# Patient Record
Sex: Male | Born: 1940 | Race: White | Hispanic: No | Marital: Married | Smoking: Former smoker
Health system: Southern US, Community
[De-identification: ages and names within clinical notes are randomized; demographics above are authoritative.]

## PROBLEM LIST (undated history)

## (undated) DIAGNOSIS — I509 Heart failure, unspecified: Secondary | ICD-10-CM

## (undated) DIAGNOSIS — I10 Essential (primary) hypertension: Secondary | ICD-10-CM

## (undated) DIAGNOSIS — I471 Supraventricular tachycardia: Secondary | ICD-10-CM

## (undated) DIAGNOSIS — E119 Type 2 diabetes mellitus without complications: Secondary | ICD-10-CM

## (undated) DIAGNOSIS — J449 Chronic obstructive pulmonary disease, unspecified: Secondary | ICD-10-CM

## (undated) HISTORY — PX: SHOULDER OPEN ROTATOR CUFF REPAIR: SHX2407

## (undated) HISTORY — PX: CHOLECYSTECTOMY: SHX55

## (undated) HISTORY — PX: CARDIAC ELECTROPHYSIOLOGY MAPPING AND ABLATION: SHX1292

## (undated) HISTORY — PX: TONSILLECTOMY: SUR1361

## (undated) HISTORY — PX: HERNIA REPAIR: SHX51

---

## 2007-10-18 ENCOUNTER — Ambulatory Visit: Payer: Self-pay | Admitting: Emergency Medicine

## 2009-01-19 ENCOUNTER — Ambulatory Visit: Payer: Self-pay | Admitting: Internal Medicine

## 2009-11-13 ENCOUNTER — Ambulatory Visit: Payer: Self-pay | Admitting: Internal Medicine

## 2009-12-28 ENCOUNTER — Ambulatory Visit: Payer: Self-pay | Admitting: Internal Medicine

## 2011-12-04 ENCOUNTER — Ambulatory Visit: Payer: Self-pay

## 2012-02-24 DIAGNOSIS — M5137 Other intervertebral disc degeneration, lumbosacral region: Secondary | ICD-10-CM | POA: Diagnosis not present

## 2012-02-24 DIAGNOSIS — M999 Biomechanical lesion, unspecified: Secondary | ICD-10-CM | POA: Diagnosis not present

## 2012-03-09 ENCOUNTER — Ambulatory Visit: Payer: Self-pay | Admitting: Medical

## 2012-03-09 DIAGNOSIS — R05 Cough: Secondary | ICD-10-CM | POA: Diagnosis not present

## 2012-03-09 DIAGNOSIS — R059 Cough, unspecified: Secondary | ICD-10-CM | POA: Diagnosis not present

## 2012-03-09 DIAGNOSIS — J449 Chronic obstructive pulmonary disease, unspecified: Secondary | ICD-10-CM | POA: Diagnosis not present

## 2012-08-28 ENCOUNTER — Ambulatory Visit: Payer: Self-pay | Admitting: Internal Medicine

## 2012-08-28 DIAGNOSIS — E119 Type 2 diabetes mellitus without complications: Secondary | ICD-10-CM | POA: Diagnosis not present

## 2012-08-28 DIAGNOSIS — Z79899 Other long term (current) drug therapy: Secondary | ICD-10-CM | POA: Diagnosis not present

## 2012-08-28 DIAGNOSIS — Z9889 Other specified postprocedural states: Secondary | ICD-10-CM | POA: Diagnosis not present

## 2012-08-28 DIAGNOSIS — R05 Cough: Secondary | ICD-10-CM | POA: Diagnosis not present

## 2012-08-28 DIAGNOSIS — I10 Essential (primary) hypertension: Secondary | ICD-10-CM | POA: Diagnosis not present

## 2012-08-28 DIAGNOSIS — J40 Bronchitis, not specified as acute or chronic: Secondary | ICD-10-CM | POA: Diagnosis not present

## 2012-08-28 DIAGNOSIS — K219 Gastro-esophageal reflux disease without esophagitis: Secondary | ICD-10-CM | POA: Diagnosis not present

## 2012-11-04 DIAGNOSIS — I10 Essential (primary) hypertension: Secondary | ICD-10-CM | POA: Diagnosis not present

## 2012-11-04 DIAGNOSIS — E119 Type 2 diabetes mellitus without complications: Secondary | ICD-10-CM | POA: Diagnosis not present

## 2012-11-04 DIAGNOSIS — I517 Cardiomegaly: Secondary | ICD-10-CM | POA: Diagnosis not present

## 2012-11-04 DIAGNOSIS — I471 Supraventricular tachycardia: Secondary | ICD-10-CM | POA: Diagnosis not present

## 2012-11-04 DIAGNOSIS — F17200 Nicotine dependence, unspecified, uncomplicated: Secondary | ICD-10-CM | POA: Diagnosis not present

## 2012-11-04 DIAGNOSIS — R072 Precordial pain: Secondary | ICD-10-CM | POA: Diagnosis not present

## 2012-11-04 DIAGNOSIS — I498 Other specified cardiac arrhythmias: Secondary | ICD-10-CM | POA: Diagnosis not present

## 2012-11-04 DIAGNOSIS — Z8249 Family history of ischemic heart disease and other diseases of the circulatory system: Secondary | ICD-10-CM | POA: Diagnosis not present

## 2012-11-04 DIAGNOSIS — I2510 Atherosclerotic heart disease of native coronary artery without angina pectoris: Secondary | ICD-10-CM | POA: Diagnosis not present

## 2012-11-04 DIAGNOSIS — K219 Gastro-esophageal reflux disease without esophagitis: Secondary | ICD-10-CM | POA: Diagnosis not present

## 2012-11-04 DIAGNOSIS — J9819 Other pulmonary collapse: Secondary | ICD-10-CM | POA: Diagnosis not present

## 2012-11-04 DIAGNOSIS — I059 Rheumatic mitral valve disease, unspecified: Secondary | ICD-10-CM | POA: Diagnosis not present

## 2012-11-04 DIAGNOSIS — E876 Hypokalemia: Secondary | ICD-10-CM | POA: Diagnosis not present

## 2012-11-04 DIAGNOSIS — R0602 Shortness of breath: Secondary | ICD-10-CM | POA: Diagnosis not present

## 2012-11-04 DIAGNOSIS — R6889 Other general symptoms and signs: Secondary | ICD-10-CM | POA: Diagnosis not present

## 2012-11-04 DIAGNOSIS — E871 Hypo-osmolality and hyponatremia: Secondary | ICD-10-CM | POA: Diagnosis not present

## 2012-11-05 DIAGNOSIS — I498 Other specified cardiac arrhythmias: Secondary | ICD-10-CM | POA: Diagnosis not present

## 2012-11-05 DIAGNOSIS — I471 Supraventricular tachycardia, unspecified: Secondary | ICD-10-CM | POA: Diagnosis not present

## 2012-11-05 DIAGNOSIS — R072 Precordial pain: Secondary | ICD-10-CM | POA: Diagnosis not present

## 2012-11-05 DIAGNOSIS — I059 Rheumatic mitral valve disease, unspecified: Secondary | ICD-10-CM | POA: Diagnosis not present

## 2012-11-05 DIAGNOSIS — R0602 Shortness of breath: Secondary | ICD-10-CM | POA: Diagnosis not present

## 2013-05-28 DIAGNOSIS — M999 Biomechanical lesion, unspecified: Secondary | ICD-10-CM | POA: Diagnosis not present

## 2013-05-28 DIAGNOSIS — M5137 Other intervertebral disc degeneration, lumbosacral region: Secondary | ICD-10-CM | POA: Diagnosis not present

## 2013-09-22 DIAGNOSIS — M5137 Other intervertebral disc degeneration, lumbosacral region: Secondary | ICD-10-CM | POA: Diagnosis not present

## 2013-09-22 DIAGNOSIS — M999 Biomechanical lesion, unspecified: Secondary | ICD-10-CM | POA: Diagnosis not present

## 2013-09-28 DIAGNOSIS — M999 Biomechanical lesion, unspecified: Secondary | ICD-10-CM | POA: Diagnosis not present

## 2013-09-28 DIAGNOSIS — M5137 Other intervertebral disc degeneration, lumbosacral region: Secondary | ICD-10-CM | POA: Diagnosis not present

## 2014-07-05 DIAGNOSIS — M999 Biomechanical lesion, unspecified: Secondary | ICD-10-CM | POA: Diagnosis not present

## 2014-07-05 DIAGNOSIS — M5137 Other intervertebral disc degeneration, lumbosacral region: Secondary | ICD-10-CM | POA: Diagnosis not present

## 2014-07-08 DIAGNOSIS — M999 Biomechanical lesion, unspecified: Secondary | ICD-10-CM | POA: Diagnosis not present

## 2014-07-08 DIAGNOSIS — M5137 Other intervertebral disc degeneration, lumbosacral region: Secondary | ICD-10-CM | POA: Diagnosis not present

## 2015-03-20 ENCOUNTER — Ambulatory Visit
Admission: EM | Admit: 2015-03-20 | Discharge: 2015-03-20 | Disposition: A | Discharge status: Home / Self Care | Payer: Medicare Other | Attending: Family Medicine | Admitting: Family Medicine

## 2015-03-20 DIAGNOSIS — J209 Acute bronchitis, unspecified: Secondary | ICD-10-CM

## 2015-03-20 DIAGNOSIS — J441 Chronic obstructive pulmonary disease with (acute) exacerbation: Secondary | ICD-10-CM

## 2015-03-20 DIAGNOSIS — J440 Chronic obstructive pulmonary disease with acute lower respiratory infection: Principal | ICD-10-CM

## 2015-03-20 HISTORY — DX: Heart failure, unspecified: I50.9

## 2015-03-20 HISTORY — DX: Type 2 diabetes mellitus without complications: E11.9

## 2015-03-20 HISTORY — DX: Essential (primary) hypertension: I10

## 2015-03-20 HISTORY — DX: Chronic obstructive pulmonary disease, unspecified: J44.9

## 2015-03-20 HISTORY — DX: Supraventricular tachycardia: I47.1

## 2015-03-20 MED ORDER — HYDROCOD POLST-CPM POLST ER 10-8 MG/5ML PO SUER: 5.0000 mL | Freq: Two times a day (BID) | ORAL | Status: DC

## 2015-03-20 MED ORDER — SULFAMETHOXAZOLE-TRIMETHOPRIM 800-160 MG PO TABS: 1.0000 | ORAL_TABLET | Freq: Two times a day (BID) | ORAL | Status: DC

## 2015-03-20 NOTE — ED Notes (Signed)
Pt c/o sinus and chest congestion for the past 3-4 days.  

## 2015-03-20 NOTE — ED Provider Notes (Signed)
CSN: [id number]     Arrival date & time 03/20/15  1150 History   First MD Initiated Contact with Patient 03/20/15 1316     Chief Complaint  Patient presents with  . Cough  . Nasal Congestion   (Consider location/radiation/quality/duration/timing/severity/associated sxs/prior Treatment) Patient is a 74 y.o. male presenting with cough. The history is provided by the patient.  Cough Cough characteristics:  Productive Sputum characteristics:  Nondescript Duration:  4 days Timing:  Constant Associated symptoms: wheezing   Associated symptoms: no ear pain, no eye discharge and no fever   Risk factors comment:  Diabetes (uncontrolled per patient); former smoker; h/o COPD   Past Medical History  Diagnosis Date  . Hypertension   . Diabetes mellitus without complication   . COPD (chronic obstructive pulmonary disease)   . CHF (congestive heart failure)   . SVT (supraventricular tachycardia)    Past Surgical History  Procedure Laterality Date  . Tonsillectomy    . Cholecystectomy    . Hernia repair    . Shoulder open rotator cuff repair Right   . Cardiac electrophysiology mapping and ablation     No family history on file. History  Substance Use Topics  . Smoking status: Former Games developermoker  . Smokeless tobacco: Never Used  . Alcohol Use: No    Review of Systems  Constitutional: Negative for fever.  HENT: Negative for ear pain.   Eyes: Negative for discharge.  Respiratory: Positive for cough and wheezing.     Allergies  Ace inhibitors  Home Medications   Prior to Admission medications   Medication Sig Start Date End Date Taking? Authorizing Provider  losartan (COZAAR) 25 MG tablet Take 25 mg by mouth daily.   Yes Historical Provider, MD  metFORMIN (GLUCOPHAGE) 500 MG tablet Take by mouth 2 (two) times daily with a meal.   Yes Historical Provider, MD  chlorpheniramine-HYDROcodone (TUSSIONEX PENNKINETIC ER) 10-8 MG/5ML SUER Take 5 mLs by mouth 2 (two) times daily. 03/20/15    Payton Mccallumrlando Levie Owensby, MD  sulfamethoxazole-trimethoprim (BACTRIM DS,SEPTRA DS) 800-160 MG per tablet Take 1 tablet by mouth 2 (two) times daily. 03/20/15   Payton Mccallumrlando Severa Jeremiah, MD   BP 151/70 mmHg  Pulse 70  Temp(Src) 98.6 F (37 C) (Oral)  Resp 18  Ht 6\' 3"  (1.905 m)  Wt 250 lb (113.399 kg)  BMI 31.25 kg/m2  SpO2 98% Physical Exam  Constitutional: He appears well-developed and well-nourished. No distress.  HENT:  Head: Normocephalic and atraumatic.  Right Ear: Tympanic membrane, external ear and ear canal normal.  Left Ear: Tympanic membrane, external ear and ear canal normal.  Nose: Nose normal.  Mouth/Throat: Uvula is midline, oropharynx is clear and moist and mucous membranes are normal. No oropharyngeal exudate or tonsillar abscesses.  Eyes: Conjunctivae and EOM are normal. Pupils are equal, round, and reactive to light. Right eye exhibits no discharge. Left eye exhibits no discharge. No scleral icterus.  Neck: Normal range of motion. Neck supple. No tracheal deviation present. No thyromegaly present.  Cardiovascular: Normal rate, regular rhythm and normal heart sounds.   Pulmonary/Chest: Effort normal. No stridor. No respiratory distress. He has wheezes. He has no rales. He exhibits no tenderness.  Diffuse expiratory wheezes and rhonchi bilaterally  Lymphadenopathy:    He has no cervical adenopathy.  Neurological: He is alert.  Skin: Skin is warm and dry. No rash noted. He is not diaphoretic.  Nursing note and vitals reviewed.   ED Course  Procedures (including critical care time) Labs Review Labs Reviewed -  No data to display  Imaging Review No results found.   MDM   1. Acute bronchitis with chronic obstructive pulmonary disease (COPD)    Discharge Medication List as of 03/20/2015  1:29 PM    START taking these medications   Details  chlorpheniramine-HYDROcodone (TUSSIONEX PENNKINETIC ER) 10-8 MG/5ML SUER Take 5 mLs by mouth 2 (two) times daily., Starting 03/20/2015, Until  Discontinued, Normal    sulfamethoxazole-trimethoprim (BACTRIM DS,SEPTRA DS) 800-160 MG per tablet Take 1 tablet by mouth 2 (two) times daily., Starting 03/20/2015, Until Discontinued, Normal      Plan: 1. Test/x-ray results and diagnosis reviewed with patient 2. rx as per orders; risks, benefits, potential side effects reviewed with patient 3. Recommend supportive treatment with rest, increased fluids 4. F/u prn if symptoms worsen or don't improve    Payton Mccallum, MD 03/20/15 1344

## 2015-05-26 DIAGNOSIS — M9903 Segmental and somatic dysfunction of lumbar region: Secondary | ICD-10-CM | POA: Diagnosis not present

## 2015-05-26 DIAGNOSIS — M9905 Segmental and somatic dysfunction of pelvic region: Secondary | ICD-10-CM | POA: Diagnosis not present

## 2015-05-26 DIAGNOSIS — M9902 Segmental and somatic dysfunction of thoracic region: Secondary | ICD-10-CM | POA: Diagnosis not present

## 2015-05-26 DIAGNOSIS — M9904 Segmental and somatic dysfunction of sacral region: Secondary | ICD-10-CM | POA: Diagnosis not present

## 2015-05-26 DIAGNOSIS — M9901 Segmental and somatic dysfunction of cervical region: Secondary | ICD-10-CM | POA: Diagnosis not present

## 2015-05-26 DIAGNOSIS — M5137 Other intervertebral disc degeneration, lumbosacral region: Secondary | ICD-10-CM | POA: Diagnosis not present

## 2015-05-26 DIAGNOSIS — M791 Myalgia: Secondary | ICD-10-CM | POA: Diagnosis not present

## 2015-05-29 DIAGNOSIS — M9902 Segmental and somatic dysfunction of thoracic region: Secondary | ICD-10-CM | POA: Diagnosis not present

## 2015-05-29 DIAGNOSIS — M5137 Other intervertebral disc degeneration, lumbosacral region: Secondary | ICD-10-CM | POA: Diagnosis not present

## 2015-05-29 DIAGNOSIS — M9905 Segmental and somatic dysfunction of pelvic region: Secondary | ICD-10-CM | POA: Diagnosis not present

## 2015-05-29 DIAGNOSIS — M791 Myalgia: Secondary | ICD-10-CM | POA: Diagnosis not present

## 2015-05-29 DIAGNOSIS — M9904 Segmental and somatic dysfunction of sacral region: Secondary | ICD-10-CM | POA: Diagnosis not present

## 2015-05-29 DIAGNOSIS — M9903 Segmental and somatic dysfunction of lumbar region: Secondary | ICD-10-CM | POA: Diagnosis not present

## 2015-05-29 DIAGNOSIS — M9901 Segmental and somatic dysfunction of cervical region: Secondary | ICD-10-CM | POA: Diagnosis not present

## 2015-06-05 DIAGNOSIS — M9903 Segmental and somatic dysfunction of lumbar region: Secondary | ICD-10-CM | POA: Diagnosis not present

## 2015-06-05 DIAGNOSIS — M9904 Segmental and somatic dysfunction of sacral region: Secondary | ICD-10-CM | POA: Diagnosis not present

## 2015-06-05 DIAGNOSIS — M9905 Segmental and somatic dysfunction of pelvic region: Secondary | ICD-10-CM | POA: Diagnosis not present

## 2015-06-05 DIAGNOSIS — M9901 Segmental and somatic dysfunction of cervical region: Secondary | ICD-10-CM | POA: Diagnosis not present

## 2015-06-05 DIAGNOSIS — M791 Myalgia: Secondary | ICD-10-CM | POA: Diagnosis not present

## 2015-06-05 DIAGNOSIS — M9902 Segmental and somatic dysfunction of thoracic region: Secondary | ICD-10-CM | POA: Diagnosis not present

## 2015-06-05 DIAGNOSIS — M5137 Other intervertebral disc degeneration, lumbosacral region: Secondary | ICD-10-CM | POA: Diagnosis not present

## 2015-06-09 DIAGNOSIS — M9905 Segmental and somatic dysfunction of pelvic region: Secondary | ICD-10-CM | POA: Diagnosis not present

## 2015-06-09 DIAGNOSIS — M5137 Other intervertebral disc degeneration, lumbosacral region: Secondary | ICD-10-CM | POA: Diagnosis not present

## 2015-06-09 DIAGNOSIS — M791 Myalgia: Secondary | ICD-10-CM | POA: Diagnosis not present

## 2015-06-09 DIAGNOSIS — M9902 Segmental and somatic dysfunction of thoracic region: Secondary | ICD-10-CM | POA: Diagnosis not present

## 2015-06-09 DIAGNOSIS — M9904 Segmental and somatic dysfunction of sacral region: Secondary | ICD-10-CM | POA: Diagnosis not present

## 2015-06-09 DIAGNOSIS — M9901 Segmental and somatic dysfunction of cervical region: Secondary | ICD-10-CM | POA: Diagnosis not present

## 2015-06-09 DIAGNOSIS — M9903 Segmental and somatic dysfunction of lumbar region: Secondary | ICD-10-CM | POA: Diagnosis not present

## 2015-06-20 DIAGNOSIS — M9904 Segmental and somatic dysfunction of sacral region: Secondary | ICD-10-CM | POA: Diagnosis not present

## 2015-06-20 DIAGNOSIS — M791 Myalgia: Secondary | ICD-10-CM | POA: Diagnosis not present

## 2015-06-20 DIAGNOSIS — M9901 Segmental and somatic dysfunction of cervical region: Secondary | ICD-10-CM | POA: Diagnosis not present

## 2015-06-20 DIAGNOSIS — M9903 Segmental and somatic dysfunction of lumbar region: Secondary | ICD-10-CM | POA: Diagnosis not present

## 2015-06-20 DIAGNOSIS — M5137 Other intervertebral disc degeneration, lumbosacral region: Secondary | ICD-10-CM | POA: Diagnosis not present

## 2015-06-20 DIAGNOSIS — M9905 Segmental and somatic dysfunction of pelvic region: Secondary | ICD-10-CM | POA: Diagnosis not present

## 2015-06-20 DIAGNOSIS — M9902 Segmental and somatic dysfunction of thoracic region: Secondary | ICD-10-CM | POA: Diagnosis not present

## 2015-06-23 DIAGNOSIS — M9905 Segmental and somatic dysfunction of pelvic region: Secondary | ICD-10-CM | POA: Diagnosis not present

## 2015-06-23 DIAGNOSIS — M5137 Other intervertebral disc degeneration, lumbosacral region: Secondary | ICD-10-CM | POA: Diagnosis not present

## 2015-06-23 DIAGNOSIS — M791 Myalgia: Secondary | ICD-10-CM | POA: Diagnosis not present

## 2015-06-23 DIAGNOSIS — M9903 Segmental and somatic dysfunction of lumbar region: Secondary | ICD-10-CM | POA: Diagnosis not present

## 2015-06-23 DIAGNOSIS — M9902 Segmental and somatic dysfunction of thoracic region: Secondary | ICD-10-CM | POA: Diagnosis not present

## 2015-06-23 DIAGNOSIS — M9904 Segmental and somatic dysfunction of sacral region: Secondary | ICD-10-CM | POA: Diagnosis not present

## 2015-06-23 DIAGNOSIS — M9901 Segmental and somatic dysfunction of cervical region: Secondary | ICD-10-CM | POA: Diagnosis not present

## 2015-07-06 DIAGNOSIS — M791 Myalgia: Secondary | ICD-10-CM | POA: Diagnosis not present

## 2015-07-06 DIAGNOSIS — M5137 Other intervertebral disc degeneration, lumbosacral region: Secondary | ICD-10-CM | POA: Diagnosis not present

## 2015-07-06 DIAGNOSIS — M9902 Segmental and somatic dysfunction of thoracic region: Secondary | ICD-10-CM | POA: Diagnosis not present

## 2015-07-06 DIAGNOSIS — M9901 Segmental and somatic dysfunction of cervical region: Secondary | ICD-10-CM | POA: Diagnosis not present

## 2015-07-06 DIAGNOSIS — M9905 Segmental and somatic dysfunction of pelvic region: Secondary | ICD-10-CM | POA: Diagnosis not present

## 2015-07-06 DIAGNOSIS — M9903 Segmental and somatic dysfunction of lumbar region: Secondary | ICD-10-CM | POA: Diagnosis not present

## 2015-07-06 DIAGNOSIS — M9904 Segmental and somatic dysfunction of sacral region: Secondary | ICD-10-CM | POA: Diagnosis not present

## 2015-10-03 ENCOUNTER — Ambulatory Visit: Payer: Medicare Other

## 2015-10-03 ENCOUNTER — Ambulatory Visit
Admission: EM | Admit: 2015-10-03 | Discharge: 2015-10-03 | Disposition: A | Discharge status: Home / Self Care | Payer: Medicare Other | Attending: Family Medicine | Admitting: Family Medicine

## 2015-10-03 DIAGNOSIS — J440 Chronic obstructive pulmonary disease with acute lower respiratory infection: Secondary | ICD-10-CM | POA: Diagnosis not present

## 2015-10-03 DIAGNOSIS — R05 Cough: Secondary | ICD-10-CM | POA: Diagnosis present

## 2015-10-03 DIAGNOSIS — I471 Supraventricular tachycardia: Secondary | ICD-10-CM | POA: Insufficient documentation

## 2015-10-03 DIAGNOSIS — I10 Essential (primary) hypertension: Secondary | ICD-10-CM | POA: Diagnosis not present

## 2015-10-03 DIAGNOSIS — Z7984 Long term (current) use of oral hypoglycemic drugs: Secondary | ICD-10-CM | POA: Insufficient documentation

## 2015-10-03 DIAGNOSIS — E119 Type 2 diabetes mellitus without complications: Secondary | ICD-10-CM | POA: Diagnosis not present

## 2015-10-03 DIAGNOSIS — I509 Heart failure, unspecified: Secondary | ICD-10-CM | POA: Diagnosis not present

## 2015-10-03 DIAGNOSIS — Z79899 Other long term (current) drug therapy: Secondary | ICD-10-CM | POA: Diagnosis not present

## 2015-10-03 DIAGNOSIS — J209 Acute bronchitis, unspecified: Secondary | ICD-10-CM | POA: Insufficient documentation

## 2015-10-03 DIAGNOSIS — Z87891 Personal history of nicotine dependence: Secondary | ICD-10-CM | POA: Insufficient documentation

## 2015-10-03 DIAGNOSIS — R0602 Shortness of breath: Secondary | ICD-10-CM | POA: Diagnosis not present

## 2015-10-03 MED ORDER — HYDROCOD POLST-CPM POLST ER 10-8 MG/5ML PO SUER: 5.0000 mL | Freq: Two times a day (BID) | ORAL | Status: DC

## 2015-10-03 MED ORDER — SULFAMETHOXAZOLE-TRIMETHOPRIM 800-160 MG PO TABS: 1.0000 | ORAL_TABLET | Freq: Two times a day (BID) | ORAL | Status: DC

## 2015-10-03 NOTE — ED Notes (Signed)
C/o cough x 1 week. "I get bronchitis once a year". Fever yesterday.

## 2015-10-03 NOTE — ED Provider Notes (Signed)
CSN: [id number]     Arrival date & time 10/03/15  1412 History   First MD Initiated Contact with Patient 10/03/15 1612     Chief Complaint  Patient presents with  . URI   (Consider location/radiation/quality/duration/timing/severity/associated sxs/prior Treatment) HPI   This 74 year old man who presents with a one-week history of a nonproductive cough. He states that he has a history of COPD and CHF along with hypertension and diabetes mellitus. He was seen here in my records review on June of this year with very similar symptoms at that time he was treated with Tasigna next cough syrup and with the Septra DS. States that that seemed to make him feel better. He has several inhalers at home but he never uses them. He does say that he gets bronchitis at least once a year. He was feverish yesterday and today is temperature is 90.9 to pulse 75 respirations 22 and O2 sat on room air is 100%.   Past Medical History  Diagnosis Date  . Hypertension   . Diabetes mellitus without complication (HCC)   . COPD (chronic obstructive pulmonary disease) (HCC)   . CHF (congestive heart failure) (HCC)   . SVT (supraventricular tachycardia) [facility](HCC)    Past Surgical History  Procedure Laterality Date  . Tonsillectomy    . Cholecystectomy    . Hernia repair    . Shoulder open rotator cuff repair Right   . Cardiac electrophysiology mapping and ablation     Family History  Problem Relation Age of Onset  . Emphysema Father    Social History  Substance Use Topics  . Smoking status: Former Games developermoker  . Smokeless tobacco: Never Used  . Alcohol Use: No    Review of Systems  Constitutional: Positive for fever. Negative for chills, activity change and appetite change.  HENT: Positive for congestion.   Respiratory: Positive for cough, shortness of breath and wheezing.   All other systems reviewed and are negative.   Allergies  Ace inhibitors  Home Medications   Prior to Admission medications    Medication Sig Start Date End Date Taking? Authorizing Provider  furosemide (LASIX) 20 MG tablet Take 40 mg by mouth daily.   Yes Historical Provider, MD  lisinopril (PRINIVIL,ZESTRIL) 10 MG tablet Take 10 mg by mouth daily.   Yes Historical Provider, MD  omeprazole (PRILOSEC) 40 MG capsule Take 40 mg by mouth daily.   Yes Historical Provider, MD  chlorpheniramine-HYDROcodone (TUSSIONEX PENNKINETIC ER) 10-8 MG/5ML SUER Take 5 mLs by mouth 2 (two) times daily. 10/03/15   Lutricia FeilWilliam P Lynden Carrithers, PA-C  losartan (COZAAR) 25 MG tablet Take 25 mg by mouth daily.    Historical Provider, MD  metFORMIN (GLUCOPHAGE) 500 MG tablet Take by mouth 2 (two) times daily with a meal.    Historical Provider, MD  sulfamethoxazole-trimethoprim (BACTRIM DS,SEPTRA DS) 800-160 MG tablet Take 1 tablet by mouth 2 (two) times daily. 10/03/15   Lutricia FeilWilliam P Brantley Naser, PA-C   Meds Ordered and Administered this Visit  Medications - No data to display  BP 189/92 mmHg  Pulse 75  Temp(Src) 99.2 F (37.3 C) (Tympanic)  Resp 22  Ht 6\' 3"  (1.905 m)  Wt 233 lb (105.688 kg)  BMI 29.12 kg/m2  SpO2 100% No data found.   Physical Exam  Constitutional: He is oriented to person, place, and time. He appears well-developed and well-nourished. No distress.  HENT:  Head: Normocephalic and atraumatic.  Eyes: Conjunctivae are normal. Pupils are equal, round, and reactive to light.  Neck: Normal range of motion. Neck supple.  Pulmonary/Chest: Effort normal. No respiratory distress. He has wheezes. He has rales. He exhibits no tenderness.  Musculoskeletal: He exhibits no edema or tenderness.  Patient has a mild antalgic gait due to a new problem that he missed last couple of days. This was not examined.  Neurological: He is alert and oriented to person, place, and time.  Skin: Skin is warm and dry. He is not diaphoretic.  Psychiatric: He has a normal mood and affect. His behavior is normal. Judgment and thought content normal.  Nursing note  and vitals reviewed.   ED Course  Procedures (including critical care time)  Labs Review Labs Reviewed - No data to display  Imaging Review Dg Chest 2 View  10/03/2015  CLINICAL DATA:  Cough and shortness of breath for 2 weeks. History of bronchitis, pneumonia, CHF hypertension. Former smoker. EXAM: CHEST  2 VIEW COMPARISON:  08/28/2012; 03/09/2012 FINDINGS: Grossly unchanged cardiac silhouette and mediastinal contours. The lungs are hyperexpanded with flattening of the diaphragms. Left basilar opacities are unchanged and favored to represent a prominent epicardial fat pad. No focal airspace opacities. No pleural effusion or pneumothorax. No evidence of edema. No acute osseous abnormalities. Stigmata of DISH throughout the thoracic spine. Post right-sided rotator cuff repair. IMPRESSION: Hyperexpanded lungs without acute cardiopulmonary disease Electronically Signed   By: Simonne Come M.D.   On: 10/03/2015 16:46     Visual Acuity Review  Right Eye Distance:   Left Eye Distance:   Bilateral Distance:    Right Eye Near:   Left Eye Near:    Bilateral Near:     I offered the patient a breathing treatment but he refused and decided that he would rather do it at home since he has materials and medications at home    MDM   1. Acute bronchitis with chronic obstructive pulmonary disease (COPD) (HCC)    New Prescriptions   CHLORPHENIRAMINE-HYDROCODONE (TUSSIONEX PENNKINETIC ER) 10-8 MG/5ML SUER    Take 5 mLs by mouth 2 (two) times daily.   SULFAMETHOXAZOLE-TRIMETHOPRIM (BACTRIM DS,SEPTRA DS) 800-160 MG TABLET    Take 1 tablet by mouth 2 (two) times daily.  Plan: 1. Test/x-ray results and diagnosis reviewed with patient 2. rx as per orders; risks, benefits, potential side effects reviewed with patient 3. Recommend supportive treatment with breathing treatments at home. IBU/tylenol PRN 4. F/u at [facility] with his PCP next week.    Lutricia Feil, PA-C 10/03/15 1700

## 2015-10-03 NOTE — Discharge Instructions (Signed)
Acute Bronchitis Bronchitis is inflammation of the airways that extend from the windpipe into the lungs (bronchi). The inflammation often causes mucus to develop. This leads to a cough, which is the most common symptom of bronchitis.  In acute bronchitis, the condition usually develops suddenly and goes away over time, usually in a couple weeks. Smoking, allergies, and asthma can make bronchitis worse. Repeated episodes of bronchitis may cause further lung problems.  CAUSES Acute bronchitis is most often caused by the same virus that causes a cold. The virus can spread from person to person (contagious) through coughing, sneezing, and touching contaminated objects. SIGNS AND SYMPTOMS  1. Cough.  2. Fever.  3. Coughing up mucus.  4. Body aches.  5. Chest congestion.  6. Chills.  7. Shortness of breath.  8. Sore throat.  DIAGNOSIS  Acute bronchitis is usually diagnosed through a physical exam. Your health care provider will also ask you questions about your medical history. Tests, such as chest X-rays, are sometimes done to rule out other conditions.  TREATMENT  Acute bronchitis usually goes away in a couple weeks. Oftentimes, no medical treatment is necessary. Medicines are sometimes given for relief of fever or cough. Antibiotic medicines are usually not needed but may be prescribed in certain situations. In some cases, an inhaler may be recommended to help reduce shortness of breath and control the cough. A cool mist vaporizer may also be used to help thin bronchial secretions and make it easier to clear the chest.  HOME CARE INSTRUCTIONS 1. Get plenty of rest.  2. Drink enough fluids to keep your urine clear or pale yellow (unless you have a medical condition that requires fluid restriction). Increasing fluids may help thin your respiratory secretions (sputum) and reduce chest congestion, and it will prevent dehydration.  3. Take medicines only as directed by your health care  provider. 4. If you were prescribed an antibiotic medicine, finish it all even if you start to feel better. 5. Avoid smoking and secondhand smoke. Exposure to cigarette smoke or irritating chemicals will make bronchitis worse. If you are a smoker, consider using nicotine gum or skin patches to help control withdrawal symptoms. Quitting smoking will help your lungs heal faster.  6. Reduce the chances of another bout of acute bronchitis by washing your hands frequently, avoiding people with cold symptoms, and trying not to touch your hands to your mouth, nose, or eyes.  7. Keep all follow-up visits as directed by your health care provider.  SEEK MEDICAL CARE IF: Your symptoms do not improve after 1 week of treatment.  SEEK IMMEDIATE MEDICAL CARE IF:  You develop an increased fever or chills.   You have chest pain.   You have severe shortness of breath.  You have bloody sputum.   You develop dehydration.  You faint or repeatedly feel like you are going to pass out.  You develop repeated vomiting.  You develop a severe headache. MAKE SURE YOU:   Understand these instructions.  Will watch your condition.  Will get help right away if you are not doing well or get worse.   This information is not intended to replace advice given to you by your health care provider. Make sure you discuss any questions you have with your health care provider.   Document Released: 11/07/2004 Document Revised: 10/21/2014 Document Reviewed: 03/23/2013 Elsevier Interactive Patient Education 2016 [employer]  Chronic Obstructive Pulmonary Disease Exacerbation Chronic obstructive pulmonary disease (COPD) is a common lung condition in which airflow  from the lungs is limited. COPD is a general term that can be used to describe many different lung problems that limit airflow, including chronic bronchitis and emphysema. COPD exacerbations are episodes when breathing symptoms become much worse and require  extra treatment. Without treatment, COPD exacerbations can be life threatening, and frequent COPD exacerbations can cause further damage to your lungs. CAUSES 9. Respiratory infections. 10. Exposure to smoke. 11. Exposure to air pollution, chemical fumes, or dust. Sometimes there is no apparent cause or trigger. RISK FACTORS 8. Smoking cigarettes. 74. Older age. 10. Frequent prior COPD exacerbations. SIGNS AND SYMPTOMS  Increased coughing.  Increased thick spit (sputum) production.  Increased wheezing.  Increased shortness of breath.  Rapid breathing.  Chest tightness. DIAGNOSIS Your medical history, a physical exam, and tests will help your health care provider make a diagnosis. Tests may include:  A chest X-ray.  Basic lab tests.  Sputum testing.  An arterial blood gas test. TREATMENT Depending on the severity of your COPD exacerbation, you may need to be admitted to a hospital for treatment. Some of the treatments commonly used to treat COPD exacerbations are:   Antibiotic medicines.  Bronchodilators. These are drugs that expand the air passages. They may be given with an inhaler or nebulizer. Spacer devices may be needed to help improve drug delivery.  Corticosteroid medicines.  Supplemental oxygen therapy.  Airway clearing techniques, such as noninvasive ventilation (NIV) and positive expiratory pressure (PEP). These provide respiratory support through a mask or other noninvasive device. HOME CARE INSTRUCTIONS  Do not smoke. Quitting smoking is very important to prevent COPD from getting worse and exacerbations from happening as often.  Avoid exposure to all substances that irritate the airway, especially to tobacco smoke.  If you were prescribed an antibiotic medicine, finish it all even if you start to feel better.  Take all medicines as directed by your health care provider.It is important to use correct technique with inhaled medicines.  Drink enough  fluids to keep your urine clear or pale yellow (unless you have a medical condition that requires fluid restriction).  Use a cool mist vaporizer. This makes it easier to clear your chest when you cough.  If you have a home nebulizer and oxygen, continue to use them as directed.  Maintain all necessary vaccinations to prevent infections.  Exercise regularly.  Eat a healthy diet.  Keep all follow-up appointments as directed by your health care provider. SEEK IMMEDIATE MEDICAL CARE IF:  You have worsening shortness of breath.  You have trouble talking.  You have severe chest pain.  You have blood in your sputum.  You have a fever.  You have weakness, vomit repeatedly, or faint.  You feel confused.  You continue to get worse. MAKE SURE YOU:  Understand these instructions.  Will watch your condition.  Will get help right away if you are not doing well or get worse.   This information is not intended to replace advice given to you by your health care provider. Make sure you discuss any questions you have with your health care provider.   Document Released: 07/28/2007 Document Revised: 10/21/2014 Document Reviewed: 06/04/2013 Elsevier Interactive Patient Education 2016 [employer].  How to Use an Inhaler Proper inhaler technique is very important. Good technique ensures that the medicine reaches the lungs. Poor technique results in depositing the medicine on the tongue and back of the throat rather than in the airways. If you do not use the inhaler with good technique, the  medicine will not help you. STEPS TO FOLLOW IF USING AN INHALER WITHOUT AN EXTENSION TUBE 12. Remove the cap from the inhaler. 13. If you are using the inhaler for the first time, you will need to prime it. Shake the inhaler for 5 seconds and release four puffs into the air, away from your face. Ask your health care provider or pharmacist if you have questions about priming your inhaler. 14. Shake the  inhaler for 5 seconds before each breath in (inhalation). 15. Position the inhaler so that the top of the canister faces up. 16. Put your index finger on the top of the medicine canister. Your thumb supports the bottom of the inhaler. 17. Open your mouth. 18. Either place the inhaler between your teeth and place your lips tightly around the mouthpiece, or hold the inhaler 1-2 inches away from your open mouth. If you are unsure of which technique to use, ask your health care provider. 19. Breathe out (exhale) normally and as completely as possible. 20. Press the canister down with your index finger to release the medicine. 21. At the same time as the canister is pressed, inhale deeply and slowly until your lungs are completely filled. This should take 4-6 seconds. Keep your tongue down. 22. Hold the medicine in your lungs for 5-10 seconds (10 seconds is best). This helps the medicine get into the small airways of your lungs. 23. Breathe out slowly, through pursed lips. Whistling is an example of pursed lips. 24. Wait at least 15-30 seconds between puffs. Continue with the above steps until you have taken the number of puffs your health care provider has ordered. Do not use the inhaler more than your health care provider tells you. 25. Replace the cap on the inhaler. 26. Follow the directions from your health care provider or the inhaler insert for cleaning the inhaler. STEPS TO FOLLOW IF USING AN INHALER WITH AN EXTENSION (SPACER) 11. Remove the cap from the inhaler. 12. If you are using the inhaler for the first time, you will need to prime it. Shake the inhaler for 5 seconds and release four puffs into the air, away from your face. Ask your health care provider or pharmacist if you have questions about priming your inhaler. 13. Shake the inhaler for 5 seconds before each breath in (inhalation). 14. Place the open end of the spacer onto the mouthpiece of the inhaler. 15. Position the inhaler so  that the top of the canister faces up and the spacer mouthpiece faces you. 16. Put your index finger on the top of the medicine canister. Your thumb supports the bottom of the inhaler and the spacer. 17. Breathe out (exhale) normally and as completely as possible. 18. Immediately after exhaling, place the spacer between your teeth and into your mouth. Close your lips tightly around the spacer. 19. Press the canister down with your index finger to release the medicine. 20. At the same time as the canister is pressed, inhale deeply and slowly until your lungs are completely filled. This should take 4-6 seconds. Keep your tongue down and out of the way. 21. Hold the medicine in your lungs for 5-10 seconds (10 seconds is best). This helps the medicine get into the small airways of your lungs. Exhale. 22. Repeat inhaling deeply through the spacer mouthpiece. Again hold that breath for up to 10 seconds (10 seconds is best). Exhale slowly. If it is difficult to take this second deep breath through the spacer, breathe normally several times  through the spacer. Remove the spacer from your mouth. 23. Wait at least 15-30 seconds between puffs. Continue with the above steps until you have taken the number of puffs your health care provider has ordered. Do not use the inhaler more than your health care provider tells you. 24. Remove the spacer from the inhaler, and place the cap on the inhaler. 25. Follow the directions from your health care provider or the inhaler insert for cleaning the inhaler and spacer. If you are using different kinds of inhalers, use your quick relief medicine to open the airways 10-15 minutes before using a steroid if instructed to do so by your health care provider. If you are unsure which inhalers to use and the order of using them, ask your health care provider, nurse, or respiratory therapist. If you are using a steroid inhaler, always rinse your mouth with water after your last puff,  then gargle and spit out the water. Do not swallow the water. AVOID:  Inhaling before or after starting the spray of medicine. It takes practice to coordinate your breathing with triggering the spray.  Inhaling through the nose (rather than the mouth) when triggering the spray. HOW TO DETERMINE IF YOUR INHALER IS FULL OR NEARLY EMPTY You cannot know when an inhaler is empty by shaking it. A few inhalers are now being made with dose counters. Ask your health care provider for a prescription that has a dose counter if you feel you need that extra help. If your inhaler does not have a counter, ask your health care provider to help you determine the date you need to refill your inhaler. Write the refill date on a calendar or your inhaler canister. Refill your inhaler 7-10 days before it runs out. Be sure to keep an adequate supply of medicine. This includes making sure it is not expired, and that you have a spare inhaler.  SEEK MEDICAL CARE IF:   Your symptoms are only partially relieved with your inhaler.  You are having trouble using your inhaler.  You have some increase in phlegm. SEEK IMMEDIATE MEDICAL CARE IF:   You feel little or no relief with your inhalers. You are still wheezing and are feeling shortness of breath or tightness in your chest or both.  You have dizziness, headaches, or a fast heart rate.  You have chills, fever, or night sweats.  You have a noticeable increase in phlegm production, or there is blood in the phlegm. MAKE SURE YOU:   Understand these instructions.  Will watch your condition.  Will get help right away if you are not doing well or get worse.   This information is not intended to replace advice given to you by your health care provider. Make sure you discuss any questions you have with your health care provider.   Document Released: 09/27/2000 Document Revised: 07/21/2013 Document Reviewed: 04/29/2013 Elsevier Interactive Patient Education [employer].

## 2016-10-28 ENCOUNTER — Institutional Professional Consult (permissible substitution): Payer: Medicare Other | Admitting: Cardiology

## 2016-11-06 ENCOUNTER — Encounter: Payer: Self-pay | Admitting: *Deleted

## 2016-11-12 ENCOUNTER — Ambulatory Visit (INDEPENDENT_AMBULATORY_CARE_PROVIDER_SITE_OTHER): Payer: Medicare Other | Admitting: Cardiology

## 2016-11-12 ENCOUNTER — Encounter (INDEPENDENT_AMBULATORY_CARE_PROVIDER_SITE_OTHER): Payer: Self-pay

## 2016-11-12 ENCOUNTER — Encounter: Payer: Self-pay | Admitting: Cardiology

## 2016-11-12 VITALS — BP 142/64 | HR 69 | Ht 75.0 in | Wt 257.4 lb

## 2016-11-12 DIAGNOSIS — I493 Ventricular premature depolarization: Secondary | ICD-10-CM | POA: Diagnosis not present

## 2016-11-12 DIAGNOSIS — I5022 Chronic systolic (congestive) heart failure: Secondary | ICD-10-CM

## 2016-11-12 DIAGNOSIS — I10 Essential (primary) hypertension: Secondary | ICD-10-CM | POA: Diagnosis not present

## 2016-11-12 DIAGNOSIS — Z77098 Contact with and (suspected) exposure to other hazardous, chiefly nonmedicinal, chemicals: Secondary | ICD-10-CM

## 2016-11-12 DIAGNOSIS — I2510 Atherosclerotic heart disease of native coronary artery without angina pectoris: Secondary | ICD-10-CM

## 2016-11-12 DIAGNOSIS — J449 Chronic obstructive pulmonary disease, unspecified: Secondary | ICD-10-CM | POA: Diagnosis not present

## 2016-11-12 DIAGNOSIS — I471 Supraventricular tachycardia: Secondary | ICD-10-CM

## 2016-11-12 DIAGNOSIS — I4719 Other supraventricular tachycardia: Secondary | ICD-10-CM | POA: Insufficient documentation

## 2016-11-12 DIAGNOSIS — E118 Type 2 diabetes mellitus with unspecified complications: Secondary | ICD-10-CM | POA: Insufficient documentation

## 2016-11-12 DIAGNOSIS — I509 Heart failure, unspecified: Secondary | ICD-10-CM | POA: Insufficient documentation

## 2016-11-12 NOTE — Progress Notes (Signed)
Electrophysiology Office Note   Date:  11/12/2016   ID:  Jerry Hooper, DOB [birth year], MRN [id number]  PCP:  No primary care provider on file. Primary Electrophysiologist:  Will Jorja Loa, MD    Chief Complaint  Patient presents with  . Advice Only    Arrhythmia/PVC's     History of Present Illness: Jerry Hooper is a 76 y.o. male who presents today for electrophysiology evaluation.   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 Echo 9/14 showed an EF of 40% with LVH and mild left atrial abnormality, global hypokinesis and grade 2 diastolic dysfunction, COPD. He wore a cardiac monitor from 05/24/2016 and 06/24/2016 showing sinus rhythm with PVCs. It was noted that the PVCs were of 2 separate morphologies. He did have evidence of a bigeminy and couplets.   Today, he denies symptoms of chest pain, shortness of breath, orthopnea, PND, claudication, dizziness, presyncope, syncope, bleeding, or neurologic sequela. The patient is tolerating medications without difficulties. He says that he was hospitalized one year ago with a CHF exacerbation. He lost 40 pounds during that admission. He says that since that time, he is slowly gaining back weight, but has had his Lasix dose increased. He says that when he takes a blood pressure, at times his pulse will drop into the high 30s. When that occurs, his blood pressure also decreases. He generally feels well otherwise with some episodes of palpitations.   Past Medical History:  Diagnosis Date  . CHF (congestive heart failure) (HCC)   . COPD (chronic obstructive pulmonary disease) (HCC)   . Diabetes mellitus without complication (HCC)   . Hypertension   . SVT (supraventricular tachycardia) (HCC)    Past Surgical History:  Procedure  Laterality Date  . CARDIAC ELECTROPHYSIOLOGY MAPPING AND ABLATION    . CHOLECYSTECTOMY    . HERNIA REPAIR    . SHOULDER OPEN ROTATOR CUFF REPAIR Right   . TONSILLECTOMY       Current Outpatient Prescriptions  Medication Sig Dispense Refill  . chlorpheniramine-HYDROcodone (TUSSIONEX PENNKINETIC ER) 10-8 MG/5ML SUER Take 5 mLs by mouth 2 (two) times daily. 140 mL 0  . furosemide (LASIX) 20 MG tablet Take 40 mg by mouth daily.    Marland Kitchen lisinopril (PRINIVIL,ZESTRIL) 10 MG tablet Take 10 mg by mouth daily.    Marland Kitchen omeprazole (PRILOSEC) 40 MG capsule Take 40 mg by mouth daily.     No current facility-administered medications for this visit.     Allergies:   Ace inhibitors   Social History:  The patient  reports that he has quit smoking. He has never used smokeless tobacco. He reports that he does not drink alcohol or use drugs.   Family History:  The patient's family history includes Emphysema in his father; Neurologic Disorder in his sister; Other in his mother; Pulmonary embolism in his mother.    ROS:  Please see the history of present illness.   Otherwise, review of systems is positive for leg swelling, shortness of breath lying down, palpitations, cough, dyspnea on exertion, constipation, back pain, headaches, easy bruising.   All other systems are reviewed and negative.    PHYSICAL EXAM: VS:  BP (!) 142/64   Pulse 69   Ht 6\' 3"  (1.905 m)   Wt 257 lb 6.4 oz (116.8 kg)   BMI 32.17 kg/m  ,  BMI Body mass index is 32.17 kg/m. GEN: Well nourished, well developed, in no acute distress  HEENT: normal  Neck: no JVD, carotid bruits, or masses Cardiac: iRRR; no murmurs, rubs, or gallops,no edema  Respiratory:  clear to auscultation bilaterally, normal work of breathing GI: soft, nontender, nondistended, + BS MS: no deformity or atrophy  Skin: warm and dry Neuro:  Strength and sensation are intact Psych: euthymic mood, full affect  EKG:  EKG is ordered today. Personal review of the ekg  ordered shows sinus rhythm, frequent PVCs, lateral ST/T-wave changes  Recent Labs: No results found for requested labs within last 8760 hours.    Lipid Panel  No results found for: CHOL, TRIG, HDL, CHOLHDL, VLDL, LDLCALC, LDLDIRECT   Wt Readings from Last 3 Encounters:  11/12/16 257 lb 6.4 oz (116.8 kg)  10/03/15 233 lb (105.7 kg)  03/20/15 250 lb (113.4 kg)      Other studies Reviewed: Additional studies/ records that were [facility] today [facility]   ASSESSMENT AND PLAN:  1.  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 He did wear a 30 day monitor that showed evidence of PVCs and bigeminy, but we do not have an accurate account of his PVC burden. We'll therefore fit him with a 48-hour monitor. He also had an echocardiogram done approximately one year ago and we will work to get the results of that.  2. 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 He is on optimal medical therapy for heart failure.  3. Hypertension: Currently well controlled at home blood pressures in the low 130s to 120s. Elevated today. We'll continue further monitoring.  Current medicines are reviewed at length with the patient today.   The patient does not have concerns regarding his medicines.  The following changes were made today:  none  Labs/ tests ordered today include:  Orders Placed This Encounter  Procedures  . Holter monitor - 48 hour  . EKG 12-Lead     Disposition:   FU with Will Camnitz 1 months  Signed, Will Jorja LoaMartin Camnitz, MD  11/12/2016 10:38 AM     [facility] [address] [place] [address] [phone] (office) [phone] (fax)

## 2016-11-12 NOTE — Patient Instructions (Signed)
Medication Instructions:    Your physician recommends that you continue on your current medications as directed. Please refer to the Current Medication list given to you today.  --- If you need a refill on your cardiac medications before your next appointment, please call your pharmacy. ---  Labwork:  None ordered  Testing/Procedures: Your physician has recommended that you wear a 48 hour holter monitor. Holter monitors are medical devices that record the heart's electrical activity. Doctors most often use these monitors to diagnose arrhythmias. Arrhythmias are problems with the speed or rhythm of the heartbeat. The monitor is a small, portable device. You can wear one while you do your normal daily activities. This is usually used to diagnose what is causing palpitations/syncope (passing out).  Follow-Up:  Your physician recommends that you schedule a follow-up appointment in: 4 weeks with Dr. Elberta Fortisamnitz  Thank you for choosing [facility]!!   Dory HornSherri Shereese Bonnie, RN [phone]  Any Other Special Instructions Will Be Listed Below (If Applicable).   Holter Monitoring Introduction A Holter monitor is a small device that is used to detect abnormal heart rhythms. It clips to your clothing and is connected by wires to flat, sticky disks (electrodes) that attach to your chest. It is worn continuously for 24-48 hours. Follow these instructions at home:  Wear your Holter monitor at all times, even while exercising and sleeping, for as long as directed by your health care provider.  Make sure that the Holter monitor is safely clipped to your clothing or close to your body as recommended by your health care provider.  Do not get the monitor or wires wet.  Do not put body lotion or moisturizer on your chest.  Keep your skin clean.  Keep a diary of your daily activities, such as walking and doing chores. If you feel that your heartbeat is abnormal or that your heart is fluttering or  skipping a beat:  Record what you are doing when it happens.  Record what time of day the symptoms occur.  Return your Holter monitor as directed by your health care provider.  Keep all follow-up visits as directed by your health care provider. This is important. Get help right away if:  You feel lightheaded or you faint.  You have trouble breathing.  You feel pain in your chest, upper arm, or jaw.  You feel sick to your stomach and your skin is pale, cool, or damp.  You heartbeat feels unusual or abnormal. This information is not intended to replace advice given to you by your health care provider. Make sure you discuss any questions you have with your health care provider. Document Released: 06/28/2004 Document Revised: 03/07/2016 Document Reviewed: 05/09/2014  2017 Elsevier

## 2016-11-18 ENCOUNTER — Ambulatory Visit (INDEPENDENT_AMBULATORY_CARE_PROVIDER_SITE_OTHER): Payer: Non-veteran care

## 2016-11-18 DIAGNOSIS — I493 Ventricular premature depolarization: Secondary | ICD-10-CM

## 2016-11-22 ENCOUNTER — Telehealth: Payer: Self-pay | Admitting: *Deleted

## 2016-11-22 NOTE — Telephone Encounter (Signed)
Holter reviewed by Dr. Jamey ReasKlein(office DOD) and OK to route to Dr. Elberta Fortisamnitz for review.  Need to get echo report.  [facility]

## 2016-11-22 NOTE — Telephone Encounter (Signed)
[facility] [place] [facility] [phone]). He is calling to report pt's holter showed 27,000 PVC's and several 3-4 beat runs of Vtach.  This has been uploaded.

## 2016-12-04 ENCOUNTER — Ambulatory Visit: Payer: Medicare Other | Admitting: Cardiology

## 2016-12-10 ENCOUNTER — Encounter: Payer: Self-pay | Admitting: Cardiology

## 2016-12-10 ENCOUNTER — Ambulatory Visit (INDEPENDENT_AMBULATORY_CARE_PROVIDER_SITE_OTHER): Payer: Medicare Other | Admitting: Cardiology

## 2016-12-10 ENCOUNTER — Ambulatory Visit: Payer: Medicare Other | Admitting: Cardiology

## 2016-12-10 VITALS — BP 130/72 | HR 52 | Ht 75.0 in | Wt 258.4 lb

## 2016-12-10 DIAGNOSIS — I493 Ventricular premature depolarization: Secondary | ICD-10-CM

## 2016-12-10 DIAGNOSIS — I2510 Atherosclerotic heart disease of native coronary artery without angina pectoris: Secondary | ICD-10-CM | POA: Diagnosis not present

## 2016-12-10 DIAGNOSIS — I5022 Chronic systolic (congestive) heart failure: Secondary | ICD-10-CM | POA: Diagnosis not present

## 2016-12-10 NOTE — Patient Instructions (Signed)
Medication Instructions:    Your physician recommends that you continue on your current medications as directed. Please refer to the Current Medication list given to you today.  --- If you need a refill on your cardiac medications before your next appointment, please call your pharmacy. ---  Labwork:  None ordered  Testing/Procedures: Your physician has requested that you have an echocardiogram. Echocardiography is a painless test that uses sound waves to create images of your heart. It provides your doctor with information about the size and shape of your heart and how well your heart's chambers and valves are working. This procedure takes approximately one hour. There are no restrictions for this procedure.  Follow-Up:  Your physician recommends that you schedule a follow-up appointment in: 3 months with Dr. Camnitz.  Thank you for choosing [facility]!!   Kennedey Digilio, RN [phone]         

## 2016-12-10 NOTE — Progress Notes (Signed)
Electrophysiology Office Note   Date:  12/10/2016   ID:  Jerry Hooper, DOB 1941/04/03, MRN [id number]  PCP:  Mayra NeerJENKINS,SHARRAH, MD Primary Electrophysiologist:  Donicia Druck Jorja LoaMartin Chilton Sallade, MD    Chief Complaint  Patient presents with  . Follow-up    PVC's     History of Present Illness: Jerry Hooper is a 76 y.o. male who presents today for electrophysiology evaluation.   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 Echo 9/14 showed an EF of 40% with LVH and mild left atrial abnormality, global hypokinesis and grade 2 diastolic dysfunction, COPD. He wore a cardiac monitor from 05/24/2016 and 06/24/2016 showing sinus rhythm with PVCs. It was noted that the PVCs were of 2 separate morphologies. He did have evidence of a bigeminy and couplets. 48 hour monitor showed 11% PVCs.   Today, he denies symptoms of chest pain, shortness of breath, orthopnea, PND, claudication, dizziness, presyncope, syncope, bleeding, or neurologic sequela. The patient is tolerating medications without difficulties. He says that he was hospitalized one year ago with a CHF exacerbation. Today he returns to clinic saying that he feels overall well. He does have baseline shortness of breath. He does not notice episodes of palpitations. He did have, 2 weeks ago, an episode where he had pain in his bilateral shoulders and upper back. The pain was not associated with exertion. The pain lasted a few days and then went away on its own.   Past Medical History:  Diagnosis Date  . CHF (congestive heart failure) (HCC)   . COPD (chronic obstructive pulmonary disease) (HCC)   . Diabetes mellitus without complication (HCC)   . Hypertension   . SVT (supraventricular tachycardia) (HCC)    Past Surgical History:  Procedure  Laterality Date  . CARDIAC ELECTROPHYSIOLOGY MAPPING AND ABLATION    . CHOLECYSTECTOMY    . HERNIA REPAIR    . SHOULDER OPEN ROTATOR CUFF REPAIR Right   . TONSILLECTOMY       Current Outpatient Prescriptions  Medication Sig Dispense Refill  . aspirin 81 MG tablet Take 81 mg by mouth daily.    . carvedilol (COREG) 3.125 MG tablet Take 3.125 mg by mouth 2 (two) times daily with a meal.    . chlorpheniramine-HYDROcodone (TUSSIONEX PENNKINETIC ER) 10-8 MG/5ML SUER Take 5 mLs by mouth 2 (two) times daily. 140 mL 0  . clobetasol cream (TEMOVATE) 0.05 % Apply 1 application topically 2 (two) times daily.    Marland Kitchen. desonide (DESOWEN) 0.05 % cream Apply 1 application topically 2 (two) times daily.    . furosemide (LASIX) 20 MG tablet Take 40 mg by mouth daily.    Marland Kitchen. glipiZIDE (GLUCOTROL) 5 MG tablet Take 2.5 mg by mouth daily before breakfast.    . lisinopril (PRINIVIL,ZESTRIL) 40 MG tablet Take 40 mg by mouth daily.    Marland Kitchen. omeprazole (PRILOSEC) 20 MG capsule Take 20 mg by mouth daily.    . pravastatin (PRAVACHOL) 80 MG tablet Take 40 mg by mouth at bedtime.    . prednisoLONE acetate (PRED FORTE) 1 % ophthalmic suspension Place 1 drop into both eyes daily.    Marland Kitchen. spironolactone (ALDACTONE) 25 MG tablet Take 2 tablets (50 mg) by mouth daily    . torsemide (DEMADEX) 20 MG tablet Take 2 tablets (40 mg) by mouth daily     No current  facility-administered medications for this visit.     Allergies:   Ace inhibitors   Social History:  The patient  reports that he has quit smoking. He has never used smokeless tobacco. He reports that he does not drink alcohol or use drugs.   Family History:  The patient's family history includes Emphysema in his father; Neurologic Disorder in his sister; Other in his mother; Pulmonary embolism in his mother.    ROS:  Please see the history of present illness.   Otherwise, review of systems is positive for Fatigue, palpitations, leg swelling, dyspnea on exertion, constipation,  balance problems, back pain, easy bruising.   All other systems are reviewed and negative.    PHYSICAL EXAM: VS:  BP 130/72   Pulse (!) 52   Ht 6\' 3"  (1.905 m)   Wt 258 lb 6.4 oz (117.2 kg)   BMI 32.30 kg/m  , BMI Body mass index is 32.3 kg/m. GEN: Well nourished, well developed, in no acute distress  HEENT: normal  Neck: no JVD, carotid bruits, or masses Cardiac: iRRR; no murmurs, rubs, or gallops,no edema  Respiratory:  clear to auscultation bilaterally, normal work of breathing GI: soft, nontender, nondistended, + BS MS: no deformity or atrophy  Skin: warm and dry Neuro:  Strength and sensation are intact Psych: euthymic mood, full affect  EKG:  EKG is ordered today. Personal review of the ekg ordered shows sinus rhythm, frequent PVCs, lateral ST/T-wave changes  Recent Labs: No results found for requested labs within last 8760 hours.    Lipid Panel  No results found for: CHOL, TRIG, HDL, CHOLHDL, VLDL, LDLCALC, LDLDIRECT   Wt Readings from Last 3 Encounters:  12/10/16 258 lb 6.4 oz (117.2 kg)  11/12/16 257 lb 6.4 oz (116.8 kg)  10/03/15 233 lb (105.7 kg)      Other studies Reviewed: Additional studies/ records that were reviewed today [facility]  48 hour monitor Dominant Rhythm  Sinus     Mean HR 67 Range HR 47-98   Total beats 190 K PVC 27728 PAC 138  Some SVT runs  Ventricular ectopy multimorphic Some short slow runs  3 beats  Tachycardia none   Pauses none   ASSESSMENT AND PLAN:  1.  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 He also has a low ejection fraction reported. Fortunately his PVCs are only 11% burden. He Jerry Hooper likely need antiarrhythmic medications, as his PVCs could be contributing his symptoms of shortness of breath. We'll repeat his echocardiogram today.  2. 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 He is on optimal medical therapy for heart failure. Plan to repeat  echocardiogram today. I did discuss with him the possibility of ICD therapy in the future.  3. Hypertension: Currently well controlled at home blood pressures in the low 130s to 120s. Elevated today. We'll continue further monitoring.  Current medicines are reviewed at length with the patient today.   The patient does not have concerns regarding his medicines.  The following changes were made today:  none  Labs/ tests ordered today include:  Orders Placed This Encounter  Procedures  . ECHOCARDIOGRAM COMPLETE     Disposition:   FU with Savannah Erbe 3 months  Signed, Alton Bouknight Jorja Loa, MD  12/10/2016 9:57 AM     [facility] [address] [place] [address] [phone] (office) [phone] (fax)

## 2016-12-26 ENCOUNTER — Other Ambulatory Visit (HOSPITAL_COMMUNITY): Payer: Medicare Other

## 2016-12-30 ENCOUNTER — Other Ambulatory Visit: Payer: Self-pay

## 2016-12-30 ENCOUNTER — Ambulatory Visit (HOSPITAL_COMMUNITY): Payer: Non-veteran care | Attending: Cardiovascular Disease

## 2016-12-30 DIAGNOSIS — I493 Ventricular premature depolarization: Secondary | ICD-10-CM

## 2016-12-30 DIAGNOSIS — I110 Hypertensive heart disease with heart failure: Secondary | ICD-10-CM | POA: Insufficient documentation

## 2016-12-30 DIAGNOSIS — J449 Chronic obstructive pulmonary disease, unspecified: Secondary | ICD-10-CM | POA: Diagnosis not present

## 2016-12-30 DIAGNOSIS — Z87891 Personal history of nicotine dependence: Secondary | ICD-10-CM | POA: Insufficient documentation

## 2016-12-30 DIAGNOSIS — I2510 Atherosclerotic heart disease of native coronary artery without angina pectoris: Secondary | ICD-10-CM | POA: Insufficient documentation

## 2016-12-30 DIAGNOSIS — I5022 Chronic systolic (congestive) heart failure: Secondary | ICD-10-CM | POA: Diagnosis not present

## 2016-12-30 DIAGNOSIS — E119 Type 2 diabetes mellitus without complications: Secondary | ICD-10-CM | POA: Insufficient documentation

## 2017-03-18 ENCOUNTER — Ambulatory Visit (INDEPENDENT_AMBULATORY_CARE_PROVIDER_SITE_OTHER): Payer: Non-veteran care | Admitting: Cardiology

## 2017-03-18 ENCOUNTER — Telehealth: Payer: Self-pay | Admitting: Cardiology

## 2017-03-18 ENCOUNTER — Encounter (INDEPENDENT_AMBULATORY_CARE_PROVIDER_SITE_OTHER): Payer: Self-pay

## 2017-03-18 ENCOUNTER — Encounter: Payer: Self-pay | Admitting: Cardiology

## 2017-03-18 VITALS — BP 146/88 | HR 78 | Ht 75.0 in | Wt 256.4 lb

## 2017-03-18 DIAGNOSIS — I493 Ventricular premature depolarization: Secondary | ICD-10-CM

## 2017-03-18 DIAGNOSIS — I10 Essential (primary) hypertension: Secondary | ICD-10-CM | POA: Diagnosis not present

## 2017-03-18 DIAGNOSIS — I255 Ischemic cardiomyopathy: Secondary | ICD-10-CM | POA: Diagnosis not present

## 2017-03-18 MED ORDER — CARVEDILOL 6.25 MG PO TABS: 6.2500 mg | ORAL_TABLET | Freq: Two times a day (BID) | ORAL | 3 refills | Status: AC

## 2017-03-18 NOTE — Progress Notes (Signed)
Electrophysiology Office Note   Date:  03/18/2017   ID:  Jerry Hooper, DOB [birth year], MRN [id number]  PCP:  Jerry Hooper, Sharrah, Jerry Hooper Primary Electrophysiologist:  Jerry Branscum Jorja LoaMartin Graylen [place], Jerry Hooper    Chief Complaint  Patient presents with  . Follow-up    PVC's     History of Present Illness: Jerry Hooper is a 76 y.o. male who presents today for electrophysiology evaluation.   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 Echo 9/14 showed an EF of 40% with LVH and mild left atrial abnormality, global hypokinesis and grade 2 diastolic dysfunction, COPD. He wore a cardiac monitor from 05/24/2016 and 06/24/2016 showing sinus rhythm with PVCs. It was noted that the PVCs were of 2 separate morphologies. He did have evidence of a bigeminy and couplets. 48 hour monitor showed 11% PVCs.   Today, denies symptoms of palpitations, chest pain, orthopnea, PND, lower extremity edema, claudication, dizziness, presyncope, syncope, bleeding, or neurologic sequela. The patient is tolerating medications without difficulties and is otherwise without complaint today. He has been feeling well without major complaint. He does have minor amounts of shortness of breath mainly with exertion.    Past Medical History:  Diagnosis Date  . CHF (congestive heart failure) (HCC)   . COPD (chronic obstructive pulmonary disease) (HCC)   . Diabetes mellitus without complication (HCC)   . Hypertension   . SVT (supraventricular tachycardia) (HCC)    Past Surgical History:  Procedure Laterality Date  . CARDIAC ELECTROPHYSIOLOGY MAPPING AND ABLATION    . CHOLECYSTECTOMY    . HERNIA REPAIR    . SHOULDER OPEN ROTATOR CUFF REPAIR Right   . TONSILLECTOMY       Current Outpatient Prescriptions  Medication Sig Dispense Refill    . aspirin 81 MG tablet Take 81 mg by mouth daily.    . clobetasol cream (TEMOVATE) 0.05 % Apply 1 application topically 2 (two) times daily.    Marland Kitchen. desonide (DESOWEN) 0.05 % cream Apply 1 application topically 2 (two) times daily.    . furosemide (LASIX) 20 MG tablet Take 40 mg by mouth daily.    Marland Kitchen. glipiZIDE (GLUCOTROL) 5 MG tablet Take 2.5 mg by mouth daily before breakfast.    . omeprazole (PRILOSEC) 20 MG capsule Take 20 mg by mouth daily.    . pravastatin (PRAVACHOL) 80 MG tablet Take 40 mg by mouth at bedtime.    . prednisoLONE acetate (PRED FORTE) 1 % ophthalmic suspension Place 1 drop into both eyes daily.    Marland Kitchen. spironolactone (ALDACTONE) 25 MG tablet Take 2 tablets (50 mg) by mouth daily    . torsemide (DEMADEX) 20 MG tablet Take 2 tablets (40 mg) by mouth daily    . carvedilol (COREG) 6.25 MG tablet Take 1 tablet (6.25 mg total) by mouth 2 (two) times daily. 180 tablet 3   No current facility-administered medications for this visit.     Allergies:   Ace inhibitors   Social History:  The patient  reports that he has quit smoking. He has never used smokeless tobacco. He reports that he does not drink alcohol or use drugs.   Family History:  The patient's family history includes Emphysema in his father; Neurologic Disorder in his sister; Other in his mother; Pulmonary embolism in his mother.    ROS:  Please see the history of  present illness.   Otherwise, review of systems is positive for Leg swelling, waking up short of breath, palpitations, cough, dyspnea on exertion, constipation, dizziness, easy bruising.   All other systems are reviewed and negative.     PHYSICAL EXAM: VS:  BP (!) 146/88   Pulse 78   Ht 6\' 3"  (1.905 m)   Wt 256 lb 6.4 oz (116.3 kg)   BMI 32.05 kg/m  , BMI Body mass index is 32.05 kg/m. GEN: Well nourished, well developed, in no acute distress  HEENT: normal  Neck: no JVD, carotid bruits, or masses Cardiac: RRR; no murmurs, rubs, or gallops,no edema   Respiratory:  clear to auscultation bilaterally, normal work of breathing GI: soft, nontender, nondistended, + BS MS: no deformity or atrophy  Skin: warm and dry Neuro:  Strength and sensation are intact Psych: euthymic mood, full affect  EKG:  EKG is not ordered today. Personal review of the ekg ordered 11/12/16 shows sinus rhythm, frequent PVCs, left anterior fascicular block, possible old septal infarction   Recent Labs: No results found for requested labs within last 8760 hours.    Lipid Panel  No results found for: CHOL, TRIG, HDL, CHOLHDL, VLDL, LDLCALC, LDLDIRECT   Wt Readings from Last 3 Encounters:  03/18/17 256 lb 6.4 oz (116.3 kg)  12/10/16 258 lb 6.4 oz (117.2 kg)  11/12/16 257 lb 6.4 oz (116.8 kg)      Other studies Reviewed: Additional studies/ records that were reviewed today include: [facility]  48 hour monitor Dominant Rhythm  Sinus     Mean HR 67 Range HR 47-98   Total beats 190 K PVC 27728 PAC 138  Some SVT runs  Ventricular ectopy multimorphic Some short slow runs  3 beats  Tachycardia none   Pauses none   TTE 12/30/16 - Left ventricle: The cavity size was at the upper limits of   normal. There was mild concentric hypertrophy. Systolic function   was mildly reduced. The estimated ejection fraction was in the   range of 45% to 50%. Although no diagnostic regional wall motion   abnormality was identified, this possibility cannot be completely   excluded on the basis of this study. There was an increased   relative contribution of atrial contraction to ventricular   filling. Incessant ectopy limits evaluation of LV diastolic   function. - Left atrium: The atrium was mildly dilated. - Right atrium: The atrium was mildly dilated.  ASSESSMENT AND PLAN:  1.  PVCs: 11% burden found on his monitor. Ejection fraction on her echo shows an EF of 45-50%. He is feeling well without major complaint. He notices minor episodes of palpitations.  We'll not make any further adjustments.  2. Reported ischemic cardiomyopathy: His been switched from lisinopril to losartan due to cough. We Jerry Hooper increase his Coreg today to 6.25. This can be increased further by his primary physician for blood pressure and heart rate management.  3. Hypertension: Blood pressure mildly elevated today. Is on losartan and carvedilol. Increasing carvedilol 6.25 today.  Current medicines are reviewed at length with the patient today.   The patient does not have concerns regarding his medicines.  The following changes were made today:  Increase carvedilol  Labs/ tests ordered today include:  No orders of the defined types were placed in this encounter.    Disposition:   FU with Nissi Doffing 6 months  Signed, Marlaysia Lenig Jorja Loa, Jerry Hooper  03/18/2017 9:52 AM     [facility] [address]  [address] [place] [address] [phone] (office) [phone] (fax)

## 2017-03-18 NOTE — Telephone Encounter (Signed)
New message       Pt was seen this am. Calling to let the nurse know that he also takes losartan 25mg  daily.

## 2017-03-18 NOTE — Patient Instructions (Addendum)
Medication Instructions:   Your physician has recommended you make the following change in your medication:   1) INCREASE Carvedilol to 6.25 mg twice daily - If you need a refill on your cardiac medications before your next appointment, please call your pharmacy.   Labwork:  None ordered  Testing/Procedures:  None ordered  Follow-Up:  Your physician wants you to follow-up in: 6 months with Dr. Elberta Fortisamnitz.  You will receive a reminder letter in the mail two months in advance. If you don't receive a letter, please call our office to schedule the follow-up appointment.  Thank you for choosing [facility]!!   Dory HornSherri Chrysta Fulcher, RN [phone]  Any Other Special Instructions Will Be Listed Below (If Applicable). Call Quaniyah Bugh, RN and let her know Losartan dosage you are taking

## 2017-03-20 MED ORDER — LOSARTAN POTASSIUM 50 MG PO TABS: 50.0000 mg | ORAL_TABLET | Freq: Every day | ORAL | 3 refills | Status: DC

## 2017-03-20 NOTE — Telephone Encounter (Signed)
Advised to increase to 50 mg daily. Wife (DPR on file) verbalized understanding and agreeable to plan.

## 2017-05-16 DIAGNOSIS — M9901 Segmental and somatic dysfunction of cervical region: Secondary | ICD-10-CM | POA: Diagnosis not present

## 2017-05-16 DIAGNOSIS — M791 Myalgia: Secondary | ICD-10-CM | POA: Diagnosis not present

## 2017-05-16 DIAGNOSIS — M4722 Other spondylosis with radiculopathy, cervical region: Secondary | ICD-10-CM | POA: Diagnosis not present

## 2017-05-16 DIAGNOSIS — M545 Low back pain: Secondary | ICD-10-CM | POA: Diagnosis not present

## 2017-05-16 DIAGNOSIS — M546 Pain in thoracic spine: Secondary | ICD-10-CM | POA: Diagnosis not present

## 2017-05-16 DIAGNOSIS — M9903 Segmental and somatic dysfunction of lumbar region: Secondary | ICD-10-CM | POA: Diagnosis not present

## 2017-05-16 DIAGNOSIS — M9904 Segmental and somatic dysfunction of sacral region: Secondary | ICD-10-CM | POA: Diagnosis not present

## 2017-05-16 DIAGNOSIS — M9902 Segmental and somatic dysfunction of thoracic region: Secondary | ICD-10-CM | POA: Diagnosis not present

## 2017-06-24 IMAGING — CR DG CHEST 2V
2 series · 2 of 2 positions shown · non-contrast
Comparison: 08/28/2012; 03/09/2012

CLINICAL DATA: Cough and shortness of breath for 2 weeks. History
of bronchitis, pneumonia, CHF hypertension. Former smoker.

EXAM:
CHEST  2 VIEW

[chest pa]
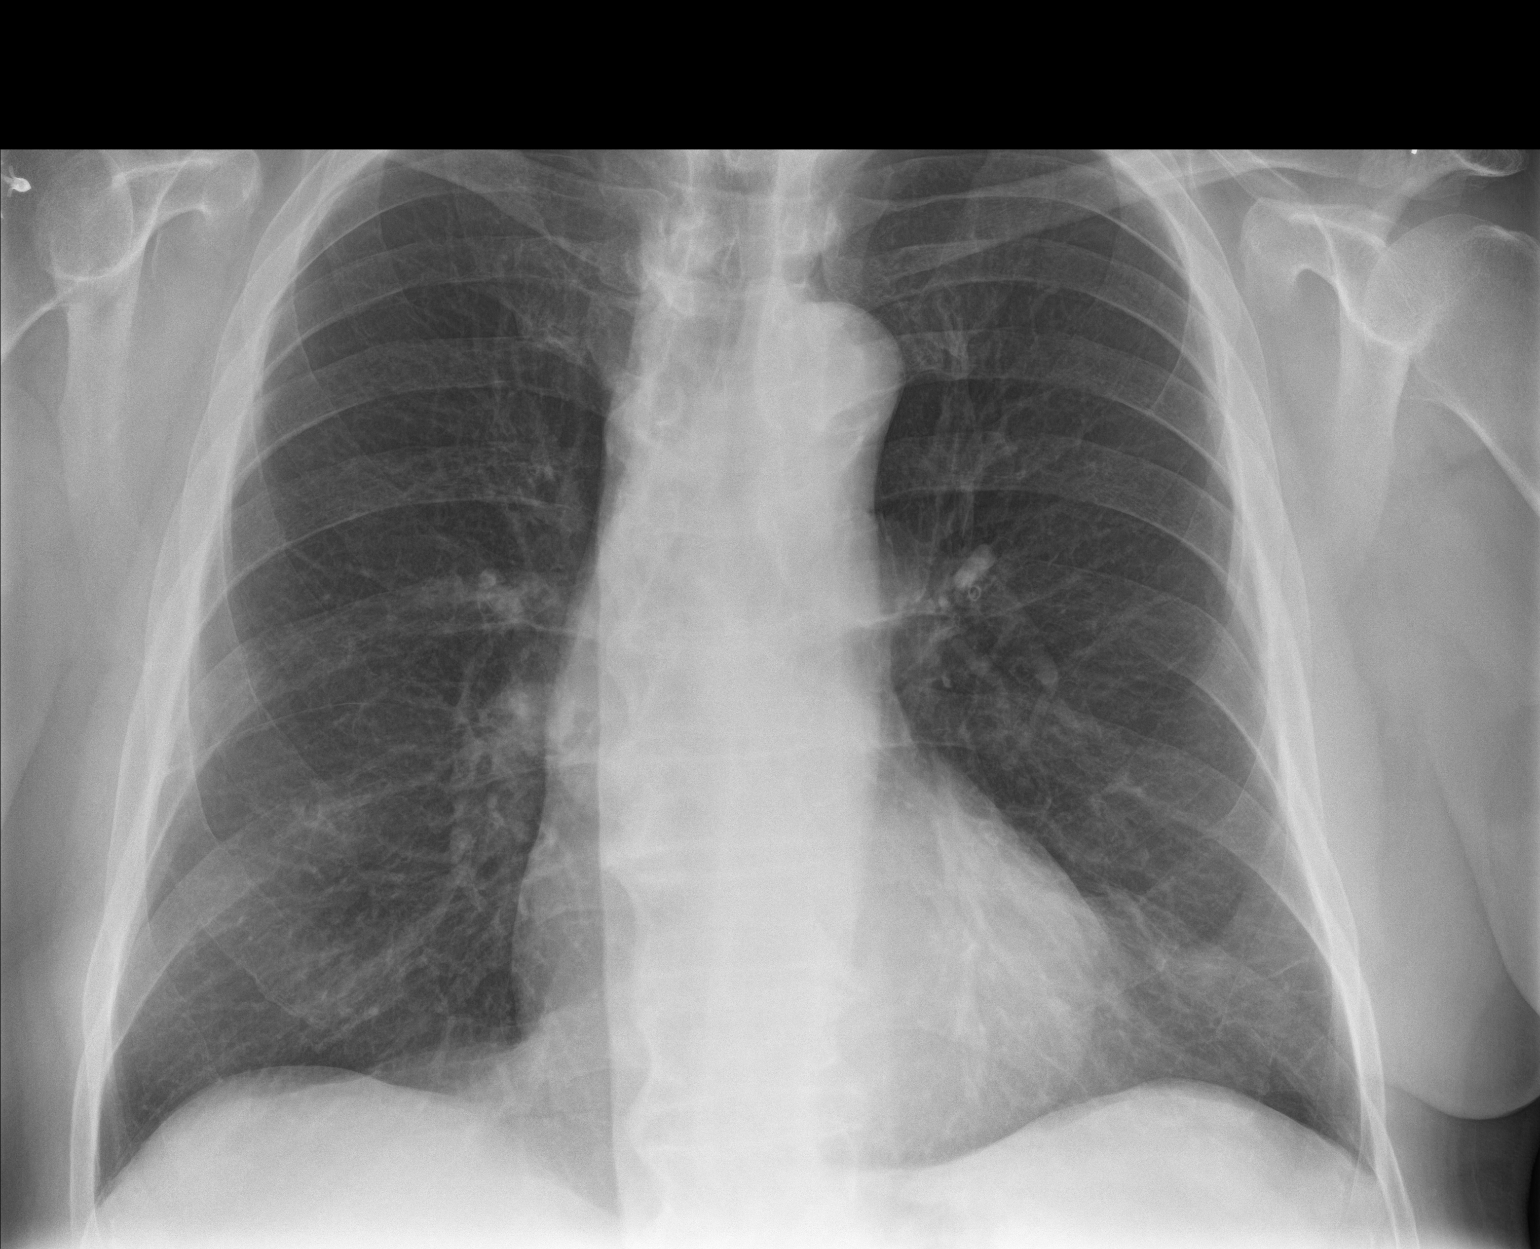

[chest lat]
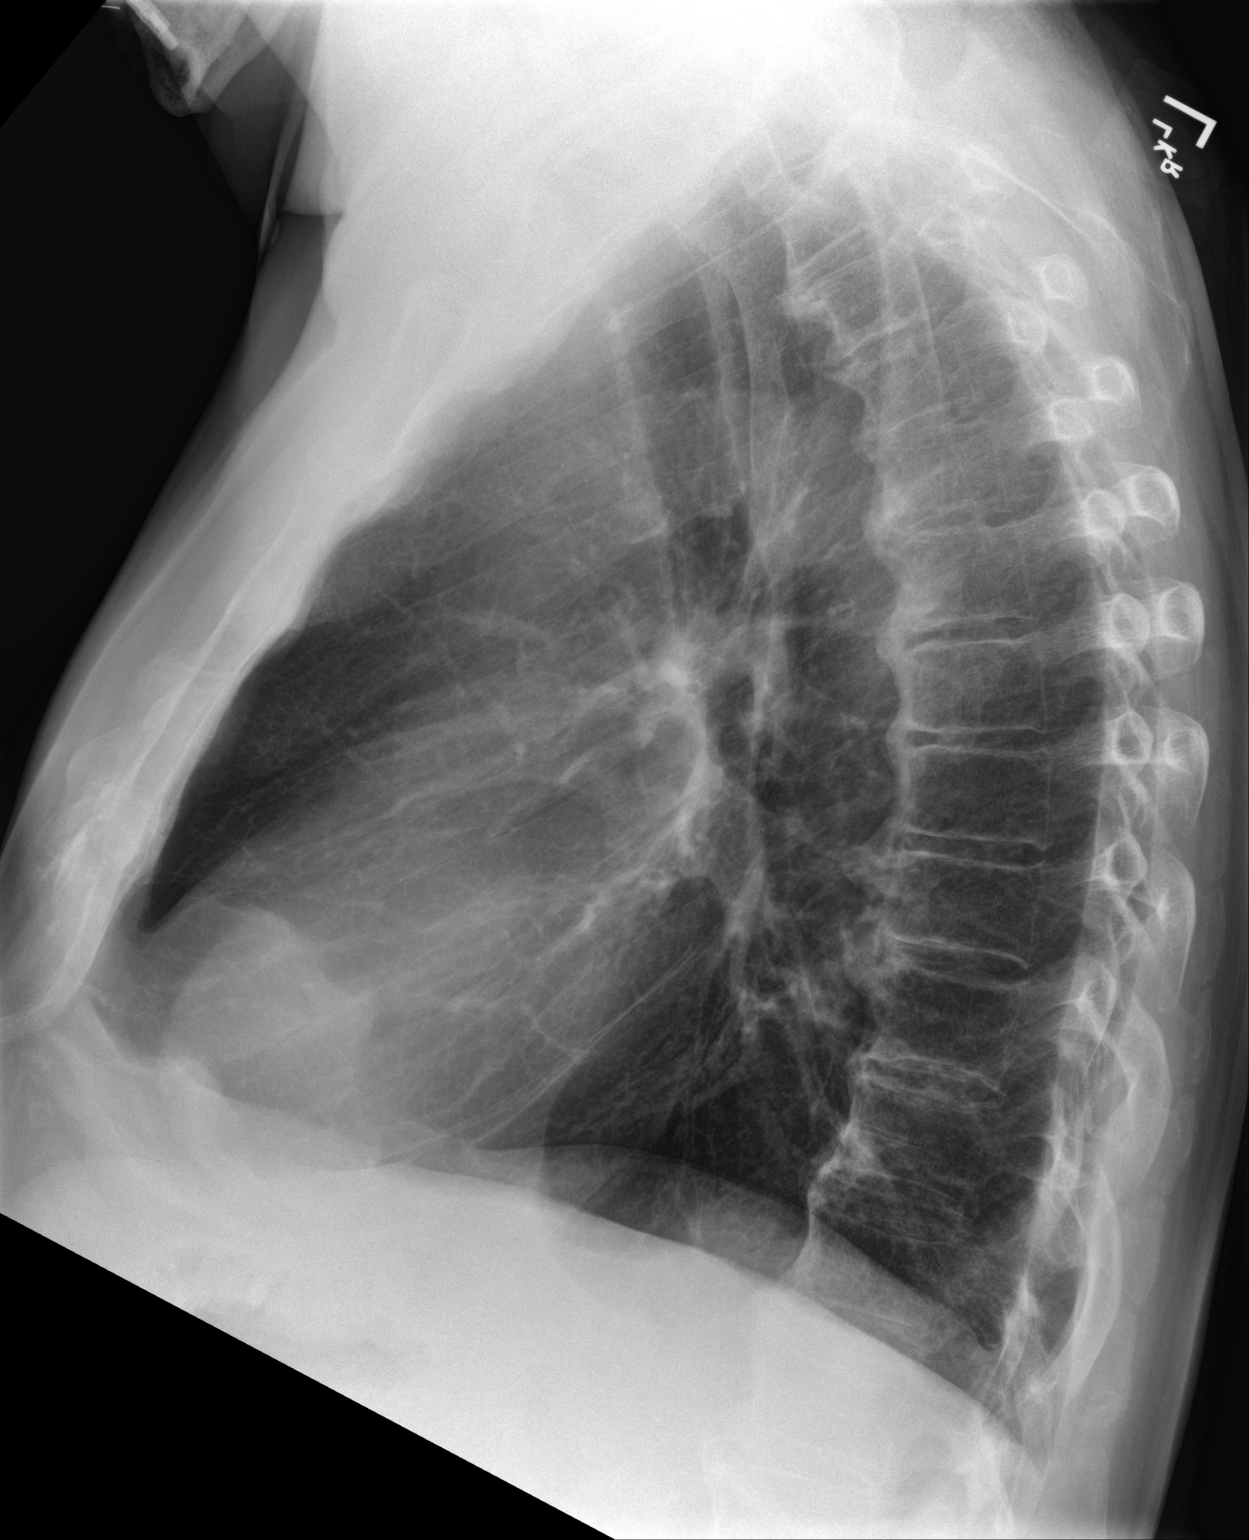

[2 of 2 positions shown; findings below may reference images not displayed]

FINDINGS: Grossly unchanged cardiac silhouette and mediastinal contours. The
lungs are hyperexpanded with flattening of the diaphragms. Left
basilar opacities are unchanged and favored to represent a prominent
epicardial fat pad. No focal airspace opacities. No pleural effusion
or pneumothorax. No evidence of edema. No acute osseous
abnormalities. Stigmata of DISH throughout the thoracic spine. Post
right-sided rotator cuff repair.
IMPRESSION: Hyperexpanded lungs without acute cardiopulmonary disease

## 2018-07-10 DIAGNOSIS — M9901 Segmental and somatic dysfunction of cervical region: Secondary | ICD-10-CM | POA: Diagnosis not present

## 2018-07-10 DIAGNOSIS — M9902 Segmental and somatic dysfunction of thoracic region: Secondary | ICD-10-CM | POA: Diagnosis not present

## 2018-07-10 DIAGNOSIS — M62830 Muscle spasm of back: Secondary | ICD-10-CM | POA: Diagnosis not present

## 2018-07-10 DIAGNOSIS — M9905 Segmental and somatic dysfunction of pelvic region: Secondary | ICD-10-CM | POA: Diagnosis not present

## 2018-07-10 DIAGNOSIS — M5137 Other intervertebral disc degeneration, lumbosacral region: Secondary | ICD-10-CM | POA: Diagnosis not present

## 2018-07-10 DIAGNOSIS — M9904 Segmental and somatic dysfunction of sacral region: Secondary | ICD-10-CM | POA: Diagnosis not present

## 2018-07-10 DIAGNOSIS — M9903 Segmental and somatic dysfunction of lumbar region: Secondary | ICD-10-CM | POA: Diagnosis not present

## 2018-07-13 DIAGNOSIS — M9901 Segmental and somatic dysfunction of cervical region: Secondary | ICD-10-CM | POA: Diagnosis not present

## 2018-07-13 DIAGNOSIS — M9903 Segmental and somatic dysfunction of lumbar region: Secondary | ICD-10-CM | POA: Diagnosis not present

## 2018-07-13 DIAGNOSIS — M5137 Other intervertebral disc degeneration, lumbosacral region: Secondary | ICD-10-CM | POA: Diagnosis not present

## 2018-07-13 DIAGNOSIS — M62830 Muscle spasm of back: Secondary | ICD-10-CM | POA: Diagnosis not present

## 2018-07-13 DIAGNOSIS — M9902 Segmental and somatic dysfunction of thoracic region: Secondary | ICD-10-CM | POA: Diagnosis not present

## 2018-07-13 DIAGNOSIS — M9905 Segmental and somatic dysfunction of pelvic region: Secondary | ICD-10-CM | POA: Diagnosis not present

## 2018-07-13 DIAGNOSIS — M9904 Segmental and somatic dysfunction of sacral region: Secondary | ICD-10-CM | POA: Diagnosis not present

## 2018-07-30 DIAGNOSIS — M9904 Segmental and somatic dysfunction of sacral region: Secondary | ICD-10-CM | POA: Diagnosis not present

## 2018-07-30 DIAGNOSIS — M5137 Other intervertebral disc degeneration, lumbosacral region: Secondary | ICD-10-CM | POA: Diagnosis not present

## 2018-07-30 DIAGNOSIS — M9901 Segmental and somatic dysfunction of cervical region: Secondary | ICD-10-CM | POA: Diagnosis not present

## 2018-07-30 DIAGNOSIS — M9902 Segmental and somatic dysfunction of thoracic region: Secondary | ICD-10-CM | POA: Diagnosis not present

## 2018-07-30 DIAGNOSIS — M62830 Muscle spasm of back: Secondary | ICD-10-CM | POA: Diagnosis not present

## 2018-07-30 DIAGNOSIS — M9903 Segmental and somatic dysfunction of lumbar region: Secondary | ICD-10-CM | POA: Diagnosis not present

## 2018-07-30 DIAGNOSIS — M9905 Segmental and somatic dysfunction of pelvic region: Secondary | ICD-10-CM | POA: Diagnosis not present

## 2018-11-21 ENCOUNTER — Emergency Department (HOSPITAL_COMMUNITY): Payer: Medicare Other

## 2018-11-21 ENCOUNTER — Other Ambulatory Visit (HOSPITAL_COMMUNITY): Payer: Medicare Other

## 2018-11-21 ENCOUNTER — Encounter (HOSPITAL_COMMUNITY): Payer: Self-pay

## 2018-11-21 ENCOUNTER — Inpatient Hospital Stay (HOSPITAL_COMMUNITY)
Admission: EM | Admit: 2018-11-21 | Discharge: 2018-11-23 | DRG: 871 | Disposition: A | Discharge status: Home / Self Care | Payer: Medicare Other | Attending: Internal Medicine | Admitting: Internal Medicine

## 2018-11-21 ENCOUNTER — Other Ambulatory Visit: Payer: Self-pay

## 2018-11-21 DIAGNOSIS — N183 Chronic kidney disease, stage 3 unspecified: Secondary | ICD-10-CM | POA: Diagnosis present

## 2018-11-21 DIAGNOSIS — N178 Other acute kidney failure: Secondary | ICD-10-CM | POA: Diagnosis not present

## 2018-11-21 DIAGNOSIS — J440 Chronic obstructive pulmonary disease with acute lower respiratory infection: Secondary | ICD-10-CM | POA: Diagnosis present

## 2018-11-21 DIAGNOSIS — Z79899 Other long term (current) drug therapy: Secondary | ICD-10-CM | POA: Diagnosis not present

## 2018-11-21 DIAGNOSIS — Z825 Family history of asthma and other chronic lower respiratory diseases: Secondary | ICD-10-CM

## 2018-11-21 DIAGNOSIS — I10 Essential (primary) hypertension: Secondary | ICD-10-CM | POA: Diagnosis not present

## 2018-11-21 DIAGNOSIS — E861 Hypovolemia: Secondary | ICD-10-CM | POA: Diagnosis present

## 2018-11-21 DIAGNOSIS — I5042 Chronic combined systolic (congestive) and diastolic (congestive) heart failure: Secondary | ICD-10-CM | POA: Diagnosis not present

## 2018-11-21 DIAGNOSIS — K219 Gastro-esophageal reflux disease without esophagitis: Secondary | ICD-10-CM | POA: Diagnosis present

## 2018-11-21 DIAGNOSIS — Y95 Nosocomial condition: Secondary | ICD-10-CM | POA: Diagnosis present

## 2018-11-21 DIAGNOSIS — Z7982 Long term (current) use of aspirin: Secondary | ICD-10-CM

## 2018-11-21 DIAGNOSIS — Z7952 Long term (current) use of systemic steroids: Secondary | ICD-10-CM | POA: Diagnosis not present

## 2018-11-21 DIAGNOSIS — N179 Acute kidney failure, unspecified: Secondary | ICD-10-CM | POA: Diagnosis present

## 2018-11-21 DIAGNOSIS — E872 Acidosis: Secondary | ICD-10-CM | POA: Diagnosis present

## 2018-11-21 DIAGNOSIS — J189 Pneumonia, unspecified organism: Secondary | ICD-10-CM | POA: Diagnosis not present

## 2018-11-21 DIAGNOSIS — J441 Chronic obstructive pulmonary disease with (acute) exacerbation: Secondary | ICD-10-CM | POA: Diagnosis present

## 2018-11-21 DIAGNOSIS — Z888 Allergy status to other drugs, medicaments and biological substances status: Secondary | ICD-10-CM

## 2018-11-21 DIAGNOSIS — J181 Lobar pneumonia, unspecified organism: Secondary | ICD-10-CM | POA: Diagnosis present

## 2018-11-21 DIAGNOSIS — E1165 Type 2 diabetes mellitus with hyperglycemia: Secondary | ICD-10-CM | POA: Diagnosis present

## 2018-11-21 DIAGNOSIS — Z7984 Long term (current) use of oral hypoglycemic drugs: Secondary | ICD-10-CM

## 2018-11-21 DIAGNOSIS — A419 Sepsis, unspecified organism: Secondary | ICD-10-CM | POA: Diagnosis present

## 2018-11-21 DIAGNOSIS — R6520 Severe sepsis without septic shock: Secondary | ICD-10-CM

## 2018-11-21 DIAGNOSIS — J449 Chronic obstructive pulmonary disease, unspecified: Secondary | ICD-10-CM | POA: Diagnosis not present

## 2018-11-21 DIAGNOSIS — E785 Hyperlipidemia, unspecified: Secondary | ICD-10-CM | POA: Diagnosis present

## 2018-11-21 DIAGNOSIS — T380X5A Adverse effect of glucocorticoids and synthetic analogues, initial encounter: Secondary | ICD-10-CM | POA: Diagnosis present

## 2018-11-21 DIAGNOSIS — R0602 Shortness of breath: Secondary | ICD-10-CM | POA: Diagnosis present

## 2018-11-21 DIAGNOSIS — Z87891 Personal history of nicotine dependence: Secondary | ICD-10-CM | POA: Diagnosis not present

## 2018-11-21 DIAGNOSIS — J9601 Acute respiratory failure with hypoxia: Secondary | ICD-10-CM | POA: Diagnosis present

## 2018-11-21 DIAGNOSIS — I130 Hypertensive heart and chronic kidney disease with heart failure and stage 1 through stage 4 chronic kidney disease, or unspecified chronic kidney disease: Secondary | ICD-10-CM | POA: Diagnosis present

## 2018-11-21 DIAGNOSIS — E118 Type 2 diabetes mellitus with unspecified complications: Secondary | ICD-10-CM | POA: Diagnosis not present

## 2018-11-21 DIAGNOSIS — E1122 Type 2 diabetes mellitus with diabetic chronic kidney disease: Secondary | ICD-10-CM | POA: Diagnosis present

## 2018-11-21 DIAGNOSIS — I471 Supraventricular tachycardia: Secondary | ICD-10-CM | POA: Diagnosis present

## 2018-11-21 DIAGNOSIS — I4719 Other supraventricular tachycardia: Secondary | ICD-10-CM | POA: Diagnosis present

## 2018-11-21 LAB — BLOOD GAS, ARTERIAL
Acid-base deficit: 3.3 mmol/L — ABNORMAL HIGH (ref 0.0–2.0)
Bicarbonate: 22.1 mmol/L (ref 20.0–28.0)
FIO2: 100
O2 Saturation: 99.8 %
PH ART: 7.472 — AB (ref 7.350–7.450)
Patient temperature: 37
pCO2 arterial: 27.3 mmHg — ABNORMAL LOW (ref 32.0–48.0)
pO2, Arterial: 260 mmHg — ABNORMAL HIGH (ref 83.0–108.0)

## 2018-11-21 LAB — URINALYSIS, ROUTINE W REFLEX MICROSCOPIC
Bacteria, UA: NONE SEEN
Bilirubin Urine: NEGATIVE
Glucose, UA: 150 mg/dL — AB
Hgb urine dipstick: NEGATIVE
Ketones, ur: NEGATIVE mg/dL
Leukocytes, UA: NEGATIVE
NITRITE: NEGATIVE
Protein, ur: >=300 mg/dL — AB
Specific Gravity, Urine: 1.016 (ref 1.005–1.030)
pH: 6 (ref 5.0–8.0)

## 2018-11-21 LAB — CBC WITH DIFFERENTIAL/PLATELET
Abs Immature Granulocytes: 0.08 10*3/uL — ABNORMAL HIGH (ref 0.00–0.07)
Basophils Absolute: 0 10*3/uL (ref 0.0–0.1)
Basophils Relative: 0 %
Eosinophils Absolute: 0.1 10*3/uL (ref 0.0–0.5)
Eosinophils Relative: 1 %
HEMATOCRIT: 34.6 % — AB (ref 39.0–52.0)
Hemoglobin: 10.7 g/dL — ABNORMAL LOW (ref 13.0–17.0)
Immature Granulocytes: 1 %
LYMPHS ABS: 0.5 10*3/uL — AB (ref 0.7–4.0)
LYMPHS PCT: 4 %
MCH: 28.2 pg (ref 26.0–34.0)
MCHC: 30.9 g/dL (ref 30.0–36.0)
MCV: 91.1 fL (ref 80.0–100.0)
Monocytes Absolute: 0.6 10*3/uL (ref 0.1–1.0)
Monocytes Relative: 5 %
Neutro Abs: 11.5 10*3/uL — ABNORMAL HIGH (ref 1.7–7.7)
Neutrophils Relative %: 89 %
Platelets: 197 10*3/uL (ref 150–400)
RBC: 3.8 MIL/uL — ABNORMAL LOW (ref 4.22–5.81)
RDW: 14.3 % (ref 11.5–15.5)
WBC: 12.8 10*3/uL — ABNORMAL HIGH (ref 4.0–10.5)
nRBC: 0 % (ref 0.0–0.2)

## 2018-11-21 LAB — BASIC METABOLIC PANEL
Anion gap: 12 (ref 5–15)
BUN: 30 mg/dL — AB (ref 8–23)
CO2: 25 mmol/L (ref 22–32)
CREATININE: 2.17 mg/dL — AB (ref 0.61–1.24)
Calcium: 8.7 mg/dL — ABNORMAL LOW (ref 8.9–10.3)
Chloride: 98 mmol/L (ref 98–111)
GFR calc Af Amer: 33 mL/min — ABNORMAL LOW (ref >60)
GFR calc non Af Amer: 28 mL/min — ABNORMAL LOW (ref >60)
Glucose, Bld: 228 mg/dL — ABNORMAL HIGH (ref 70–99)
Potassium: 4.1 mmol/L (ref 3.5–5.1)
SODIUM: 135 mmol/L (ref 135–145)

## 2018-11-21 LAB — PROTIME-INR
INR: 1.01
Prothrombin Time: 13.2 seconds (ref 11.4–15.2)

## 2018-11-21 LAB — LACTIC ACID, PLASMA
Lactic Acid, Venous: 1.4 mmol/L (ref 0.5–1.9)
Lactic Acid, Venous: 2.5 mmol/L (ref 0.5–1.9)
Lactic Acid, Venous: 3.7 mmol/L (ref 0.5–1.9)
Lactic Acid, Venous: 2.6 mmol/L (ref 0.5–1.9)

## 2018-11-21 LAB — CBG MONITORING, ED
Glucose-Capillary: 221 mg/dL — ABNORMAL HIGH (ref 70–99)
Glucose-Capillary: 262 mg/dL — ABNORMAL HIGH (ref 70–99)
Glucose-Capillary: 359 mg/dL — ABNORMAL HIGH (ref 70–99)
Glucose-Capillary: 405 mg/dL — ABNORMAL HIGH (ref 70–99)

## 2018-11-21 LAB — BRAIN NATRIURETIC PEPTIDE: B Natriuretic Peptide: 413 pg/mL — ABNORMAL HIGH (ref 0.0–100.0)

## 2018-11-21 LAB — TROPONIN I: Troponin I: <0.03 ng/mL (ref <0.03)

## 2018-11-21 LAB — PROCALCITONIN: Procalcitonin: 3.79 ng/mL

## 2018-11-21 LAB — INFLUENZA PANEL BY PCR (TYPE A & B)
INFLAPCR: NEGATIVE
Influenza B By PCR: NEGATIVE

## 2018-11-21 LAB — GLUCOSE, CAPILLARY: Glucose-Capillary: 438 mg/dL — ABNORMAL HIGH (ref 70–99)

## 2018-11-21 MED ORDER — GUAIFENESIN ER 600 MG PO TB12
1200.0000 mg | ORAL_TABLET | Freq: Two times a day (BID) | ORAL | Status: DC
  Administered 2018-11-21 – 2018-11-23 (×5): 1200 mg via ORAL
  Filled 2018-11-21 (×9): qty 2

## 2018-11-21 MED ORDER — ASPIRIN 81 MG PO CHEW
81.0000 mg | CHEWABLE_TABLET | Freq: Every day | ORAL | Status: DC
  Administered 2018-11-21 – 2018-11-23 (×3): 81 mg via ORAL
  Filled 2018-11-21 (×3): qty 1

## 2018-11-21 MED ORDER — ACETAMINOPHEN 650 MG RE SUPP
650.0000 mg | Freq: Once | RECTAL | Status: AC
  Administered 2018-11-21: 650 mg via RECTAL
  Filled 2018-11-21: qty 1

## 2018-11-21 MED ORDER — ACETAMINOPHEN 325 MG PO TABS: 650.0000 mg | ORAL_TABLET | Freq: Four times a day (QID) | ORAL | Status: DC | PRN

## 2018-11-21 MED ORDER — HEPARIN SODIUM (PORCINE) 5000 UNIT/ML IJ SOLN
5000.0000 [IU] | Freq: Three times a day (TID) | INTRAMUSCULAR | Status: DC
  Administered 2018-11-21 – 2018-11-23 (×6): 5000 [IU] via SUBCUTANEOUS
  Filled 2018-11-21 (×6): qty 1

## 2018-11-21 MED ORDER — SODIUM CHLORIDE 0.9 % IV SOLN
2.0000 g | INTRAVENOUS | Status: DC
  Filled 2018-11-21 (×2): qty 2

## 2018-11-21 MED ORDER — ONDANSETRON HCL 4 MG/2ML IJ SOLN: 4.0000 mg | Freq: Four times a day (QID) | INTRAMUSCULAR | Status: DC | PRN

## 2018-11-21 MED ORDER — PRAVASTATIN SODIUM 40 MG PO TABS
40.0000 mg | ORAL_TABLET | Freq: Every day | ORAL | Status: DC
  Administered 2018-11-21 – 2018-11-22 (×2): 40 mg via ORAL
  Filled 2018-11-21: qty 4
  Filled 2018-11-21 (×3): qty 1

## 2018-11-21 MED ORDER — INSULIN ASPART 100 UNIT/ML ~~LOC~~ SOLN
0.0000 [IU] | Freq: Every day | SUBCUTANEOUS | Status: DC
  Administered 2018-11-22: 4 [IU] via SUBCUTANEOUS

## 2018-11-21 MED ORDER — SODIUM CHLORIDE 0.9 % IV SOLN
INTRAVENOUS | Status: DC
  Administered 2018-11-21: 10:00:00 via INTRAVENOUS

## 2018-11-21 MED ORDER — METHYLPREDNISOLONE SODIUM SUCC 125 MG IJ SOLR
60.0000 mg | Freq: Two times a day (BID) | INTRAMUSCULAR | Status: DC
  Administered 2018-11-21 – 2018-11-22 (×3): 60 mg via INTRAVENOUS
  Filled 2018-11-21 (×3): qty 2

## 2018-11-21 MED ORDER — VANCOMYCIN HCL 10 G IV SOLR
1250.0000 mg | INTRAVENOUS | Status: DC
  Filled 2018-11-21 (×2): qty 1250

## 2018-11-21 MED ORDER — SODIUM CHLORIDE 0.9 % IV SOLN
500.0000 mg | INTRAVENOUS | Status: DC
  Administered 2018-11-21: 500 mg via INTRAVENOUS
  Filled 2018-11-21: qty 500

## 2018-11-21 MED ORDER — INSULIN ASPART 100 UNIT/ML ~~LOC~~ SOLN
0.0000 [IU] | SUBCUTANEOUS | Status: DC
  Administered 2018-11-21: 8 [IU] via SUBCUTANEOUS
  Filled 2018-11-21: qty 1

## 2018-11-21 MED ORDER — ALBUTEROL (5 MG/ML) CONTINUOUS INHALATION SOLN
10.0000 mg/h | INHALATION_SOLUTION | Freq: Once | RESPIRATORY_TRACT | Status: AC
  Administered 2018-11-21: 10 mg/h via RESPIRATORY_TRACT
  Filled 2018-11-21: qty 20

## 2018-11-21 MED ORDER — SODIUM CHLORIDE 0.9 % IV BOLUS
500.0000 mL | Freq: Once | INTRAVENOUS | Status: AC
  Administered 2018-11-21: 500 mL via INTRAVENOUS

## 2018-11-21 MED ORDER — ONDANSETRON HCL 4 MG PO TABS
4.0000 mg | ORAL_TABLET | Freq: Four times a day (QID) | ORAL | Status: DC | PRN
  Administered 2018-11-23: 4 mg via ORAL
  Filled 2018-11-21: qty 1

## 2018-11-21 MED ORDER — ALBUTEROL SULFATE (2.5 MG/3ML) 0.083% IN NEBU: 2.5000 mg | INHALATION_SOLUTION | RESPIRATORY_TRACT | Status: DC | PRN

## 2018-11-21 MED ORDER — IPRATROPIUM BROMIDE 0.02 % IN SOLN
1.0000 mg | Freq: Once | RESPIRATORY_TRACT | Status: AC
  Administered 2018-11-21: 1 mg via RESPIRATORY_TRACT
  Filled 2018-11-21: qty 5

## 2018-11-21 MED ORDER — LORAZEPAM 2 MG/ML IJ SOLN
0.5000 mg | Freq: Once | INTRAMUSCULAR | Status: AC
  Administered 2018-11-21: 0.5 mg via INTRAVENOUS
  Filled 2018-11-21: qty 1

## 2018-11-21 MED ORDER — SODIUM CHLORIDE 0.9 % IV SOLN
1.0000 g | INTRAVENOUS | Status: DC
  Administered 2018-11-21: 1 g via INTRAVENOUS
  Filled 2018-11-21: qty 10

## 2018-11-21 MED ORDER — LORAZEPAM 0.5 MG PO TABS: 0.5000 mg | ORAL_TABLET | Freq: Once | ORAL | Status: DC

## 2018-11-21 MED ORDER — VANCOMYCIN HCL 10 G IV SOLR
2000.0000 mg | Freq: Once | INTRAVENOUS | Status: AC
  Administered 2018-11-21: 2000 mg via INTRAVENOUS
  Filled 2018-11-21: qty 2000

## 2018-11-21 MED ORDER — INSULIN ASPART 100 UNIT/ML ~~LOC~~ SOLN
0.0000 [IU] | Freq: Three times a day (TID) | SUBCUTANEOUS | Status: DC
  Administered 2018-11-21 – 2018-11-22 (×2): 15 [IU] via SUBCUTANEOUS
  Administered 2018-11-22: 8 [IU] via SUBCUTANEOUS
  Administered 2018-11-22: 11 [IU] via SUBCUTANEOUS
  Administered 2018-11-23: 8 [IU] via SUBCUTANEOUS
  Administered 2018-11-23: 5 [IU] via SUBCUTANEOUS
  Filled 2018-11-21: qty 1

## 2018-11-21 MED ORDER — INSULIN ASPART 100 UNIT/ML ~~LOC~~ SOLN
7.0000 [IU] | Freq: Once | SUBCUTANEOUS | Status: AC
  Administered 2018-11-21: 7 [IU] via SUBCUTANEOUS

## 2018-11-21 MED ORDER — ACETAMINOPHEN 650 MG RE SUPP: 650.0000 mg | Freq: Four times a day (QID) | RECTAL | Status: DC | PRN

## 2018-11-21 MED ORDER — SODIUM CHLORIDE 0.9 % IV SOLN
2.0000 g | Freq: Once | INTRAVENOUS | Status: AC
  Administered 2018-11-21: 12:00:00 via INTRAVENOUS
  Filled 2018-11-21: qty 2

## 2018-11-21 MED ORDER — IPRATROPIUM-ALBUTEROL 0.5-2.5 (3) MG/3ML IN SOLN
3.0000 mL | Freq: Four times a day (QID) | RESPIRATORY_TRACT | Status: DC
  Administered 2018-11-21 – 2018-11-22 (×3): 3 mL via RESPIRATORY_TRACT
  Filled 2018-11-21 (×4): qty 3

## 2018-11-21 NOTE — H&P (Addendum)
History and Physical    Jerry Hooper EPP:[id number] DOB: 1941/02/23 DOA: 11/21/2018  PCP: Mayra Neer, MD  Patient coming from: home  I have personally briefly reviewed patient's old medical records in [facility]  Chief Complaint: shortness of breath  HPI: Jerry Hooper is a 78 y.o. 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  Patient is currently on BiPAP and is unable to provide any meaningful history.  Most of the history is obtained by from the patient's wife.  She reports that this morning, he became acutely short of breath.  He has not been complaining of worsening shortness of breath over the last few days.  He has not had any chest pain.  He does have a chronic cough which is relatively unchanged.  He has not had any sick contacts.  He does not use any oxygen at home.  He has not had any nausea, vomiting, diarrhea.  EMS was called due to worsening shortness of breath and patient received some nitroglycerin.  CPAP was applied for shortness of breath, but EMS had difficulty applying mask.  They held the mask on his face.  Patient was becoming increasingly anxious.  On arrival to the emergency room, BiPAP mask was applied and patient initially felt better.  He received albuterol treatment, antibiotics.  Chest x-ray indicated possible pneumonia.  He was noted to be febrile at 102.  Blood pressure was noted to be low and he had an elevated lactic acid.  Flu panel checked and was found to be negative.  Creatinine elevated at 2.1, but no prior baseline for comparison.  He has been referred for admission.  Review of Systems: As per HPI otherwise 10 point review of systems negative.    Past Medical History:  Diagnosis Date  . CHF (congestive heart failure) (HCC)   . COPD (chronic obstructive pulmonary disease) (HCC)   . Diabetes mellitus  without complication (HCC)   . Hypertension   . SVT (supraventricular tachycardia) (HCC)     Past Surgical History:  Procedure Laterality Date  . CARDIAC ELECTROPHYSIOLOGY MAPPING AND ABLATION    . CHOLECYSTECTOMY    . HERNIA REPAIR    . SHOULDER OPEN ROTATOR CUFF REPAIR Right   . TONSILLECTOMY      Social History:  reports that he has quit smoking. He has never used smokeless tobacco. He reports that he does not drink alcohol or use drugs.  Allergies  Allergen Reactions  . Ace Inhibitors Swelling    Family History  Problem Relation Age of Onset  . Other Mother        BRAIN TUMOR  . Pulmonary embolism Mother        S/P SURGERY  . Emphysema Father   . Neurologic Disorder Sister        effects walking     Prior to Admission medications   Medication Sig Start Date End Date Taking? Authorizing Provider  aspirin 81 MG tablet Take 81 mg by mouth daily.   Yes [provider]  carvedilol (COREG) 6.25 MG tablet Take 1 tablet (6.25 mg total) by mouth 2 (two) times daily. 03/18/17  Yes Camnitz, Will Daphine Deutscher, MD  clobetasol cream (TEMOVATE) 0.05 % Apply 1 application topically 2 (two) times daily.   Yes [provider]  desonide (DESOWEN) 0.05 % cream Apply 1 application topically 2 (two) times daily.  Yes [provider]  glipiZIDE (GLUCOTROL) 5 MG tablet Take 2.5 mg by mouth daily before breakfast.   Yes [provider]  losartan (COZAAR) 50 MG tablet Take 1 tablet (50 mg total) by mouth daily. Patient taking differently: Take 25 mg by mouth daily.  03/20/17 11/21/18 Yes Camnitz, Will Daphine Deutscher, MD  omeprazole (PRILOSEC) 20 MG capsule Take 20 mg by mouth daily as needed (heartburn).    Yes [provider]  pravastatin (PRAVACHOL) 80 MG tablet Take 40 mg by mouth at bedtime.   Yes [provider]  prednisoLONE acetate (PRED FORTE) 1 % ophthalmic suspension Place 1 drop into both eyes daily.   Yes [provider]  spironolactone  (ALDACTONE) 25 MG tablet Take 2 tablets (50 mg) by mouth daily   Yes [provider]  torsemide (DEMADEX) 20 MG tablet Take 2 tablets (40 mg) by mouth daily   Yes [provider]  furosemide (LASIX) 20 MG tablet Take 40 mg by mouth daily.    [provider]    Physical Exam: Vitals:   11/21/18 0900 11/21/18 0930 11/21/18 1021 11/21/18 1100  BP: (!) 123/56 112/72 (!) 82/44 (!) 89/46  Pulse: 70 88 82 81  Resp: 17 19 19  (!) 25  Temp:      TempSrc:      SpO2: 100% 100% 100% 100%  Weight:        Constitutional: sitting up in bed, appears uncomfortable on bipap, repeatedly trying to remove bipap mask Eyes: PERRL, lids and conjunctivae normal ENMT: unable to assess due to bipap mask Neck: normal, supple, no masses, no thyromegaly Respiratory: crackles on left side, no wheezing. Increased respiratory effort.  Cardiovascular: Regular rate and rhythm, no murmurs / rubs / gallops. 1+ extremity edema. 2+ pedal pulses. No carotid bruits.  Abdomen: no tenderness, no masses palpated. No hepatosplenomegaly. Bowel sounds positive.  Musculoskeletal: no clubbing / cyanosis. No joint deformity upper and lower extremities. Good ROM, no contractures. Normal muscle tone.  Skin: no rashes, lesions, ulcers. No induration Neurologic: CN 2-12 grossly intact. Sensation intact, DTR normal. Strength 5/5 in all 4.  Psychiatric: unable to engage in conversation due to respiratory issues    Labs on Admission: I have personally reviewed following labs and imaging studies  CBC: Recent Labs  Lab 11/21/18 0813  WBC 12.8*  NEUTROABS 11.5*  HGB 10.7*  HCT 34.6*  MCV 91.1  PLT 197   Basic Metabolic Panel: Recent Labs  Lab 11/21/18 0813  NA 135  K 4.1  CL 98  CO2 25  GLUCOSE 228*  BUN 30*  CREATININE 2.17*  CALCIUM 8.7*   GFR: CrCl cannot be calculated (Unknown ideal weight.). Liver Function Tests: No results for input(s): AST, ALT, ALKPHOS, BILITOT, PROT, ALBUMIN in the  last 168 hours. No results for input(s): LIPASE, AMYLASE in the last 168 hours. No results for input(s): AMMONIA in the last 168 hours. Coagulation Profile: Recent Labs  Lab 11/21/18 0813  INR 1.01   Cardiac Enzymes: Recent Labs  Lab 11/21/18 0813  TROPONINI <0.03   BNP (last 3 results) No results for input(s): PROBNP in the last 8760 hours. HbA1C: No results for input(s): HGBA1C in the last 72 hours. CBG: Recent Labs  Lab 11/21/18 0823  GLUCAP 221*   Lipid Profile: No results for input(s): CHOL, HDL, LDLCALC, TRIG, CHOLHDL, LDLDIRECT in the last 72 hours. Thyroid Function Tests: No results for input(s): TSH, T4TOTAL, FREET4, T3FREE, THYROIDAB in the last 72 hours. Anemia Panel:  No results for input(s): VITAMINB12, FOLATE, FERRITIN, TIBC, IRON, RETICCTPCT in the last 72 hours. Urine analysis:    Component Value Date/Time   COLORURINE YELLOW 11/21/2018 0813   APPEARANCEUR CLEAR 11/21/2018 0813   LABSPEC 1.016 11/21/2018 0813   PHURINE 6.0 11/21/2018 0813   GLUCOSEU 150 (A) 11/21/2018 0813   HGBUR NEGATIVE 11/21/2018 0813   BILIRUBINUR NEGATIVE 11/21/2018 0813   KETONESUR NEGATIVE 11/21/2018 0813   PROTEINUR >=300 (A) 11/21/2018 0813   NITRITE NEGATIVE 11/21/2018 0813   LEUKOCYTESUR NEGATIVE 11/21/2018 0813    Radiological Exams on Admission: Dg Chest Port 1 View  Result Date: 11/21/2018 CLINICAL DATA:  78 year old male with a history of shortness of breath EXAM: PORTABLE CHEST 1 VIEW COMPARISON:  None. FINDINGS: Cardiomediastinal silhouette enlarged. No evidence of central vascular congestion. No interlobular septal thickening. Left base has been excluded from the exam. No pneumothorax. No large pleural effusion. Patchy opacity at the right lung base. Surgical changes of the right humerus IMPRESSION: Opacity at the right lung base, potentially atelectasis or pneumonia. Electronically Signed   By: Gilmer MorJaime  Wagner D.O.   On: 11/21/2018 09:17    EKG: Independently  reviewed. Sinus rhythm with frequent pvc  Assessment/Plan Active Problems:   Essential hypertension   Type 2 diabetes mellitus with complication (HCC)   AVNRT (AV nodal re-entry tachycardia) (HCC)   COPD (chronic obstructive pulmonary disease) (HCC)   Acute respiratory failure (HCC)   Chronic combined systolic and diastolic CHF (congestive heart failure) (HCC)   AKI (acute kidney injury) (HCC)   CAP (community acquired pneumonia)   HLD (hyperlipidemia)   GERD (gastroesophageal reflux disease)   Sepsis (HCC)     1. Sepsis.  Likely secondary to pneumonia.  Patient is febrile, coughing and short of breath.  Wife reports that [facility] past few weeks for volume overload.  Will treat as HCAP.  Started on intravenous antibiotics with vancomycin and cefepime.  Cultures have been sent.  Follow lactic acid.  Follow hemodynamics.  With his CHF status unknown, but suspected systolic dysfunction and signs of some volume overload, will not bolus at this time unless patient remains hypotensive.  Currently with blood pressures in the low 100s..  Continue IV hydration. 2. HCAP.  Started on vancomycin and cefepime.  Check urinary antigens. 3. Acute respiratory failure with hypoxia.  Related to pneumonia.  Currently on BiPAP.  Will wean off as tolerated. 4. Chronic combined CHF.  He does have some lower extremity edema, but wife reports this is chronic.  With elevated lactic acid, low blood pressure, it appears that he is likely intravascularly volume depleted.  Hold off on further diuretics for now.  The fact that he is on torsemide and spironolactone indicate that his EF is probably low.  Will check echocardiogram.  Hold beta-blocker in the setting of sepsis. 5. Acute kidney injury.  He may have an element of chronic kidney disease, with no prior labs for comparison.  Check renal ultrasound.  Follow renal function with hydration. 6. COPD.  Likely contributing to  shortness of breath.  Continue bronchodilators.  In the setting of pneumonia, will start intravenous steroids. 7. Diabetes.  Hold glipizide.  Start on sliding scale insulin. 8. Hyperlipidemia.  Continue statin 9. GERD.  Continue on PPI 10. AVNRT, status post ablation in the past.  DVT prophylaxis: heparin Code Status: full code  Family Communication: discussed with wife and son at the bedside  Disposition Plan: discharge home once  respiratory status has stabilized  Consults called:   Admission status: inpatient, stepdown   Erick Blinks MD [facility]  Critical care: 45 minutes.  If 7PM-7AM, please contact night-coverage [email or web address]   11/21/2018, 11:20 AM

## 2018-11-21 NOTE — Progress Notes (Signed)
**Note De-identified Jerry Hooper Obfuscation** Patient removed from BIPAP and placed on 3L [place]; tolerating well.  RRT to continue to monitor. 

## 2018-11-21 NOTE — ED Notes (Signed)
CRITICAL VALUE ALERT  Critical Value: Lactic 2.5  Date & Time Notied:  11/21/2018 1105  Provider Notified: Dr. Kerry Hough   Orders Received/Actions taken:None yet

## 2018-11-21 NOTE — ED Notes (Signed)
Pt continually bending arm. Have applied arm board to help intake of fluids and antibiotics

## 2018-11-21 NOTE — ED Notes (Signed)
Have notified family that patient will be getting switched off Bipap

## 2018-11-21 NOTE — ED Notes (Signed)
Have given pt heart healthy meal tray

## 2018-11-21 NOTE — ED Notes (Signed)
Date and time results received: 11/21/18 1857   Test: Lactic Acid Critical Value: 2.6  Name of Provider Notified: Kerry Hough, MD

## 2018-11-21 NOTE — ED Notes (Signed)
Have notified Respiratory of Dr. Benetta Spar request to come adjust mask

## 2018-11-21 NOTE — ED Triage Notes (Signed)
Family called EMS for resp distress. Respirations 32. Per EMS very diminished breath sounds. CPAP en route. BiPap once at ED. BP 180 systolic. Given 1 Nitro by EMS. Lower extremity swelling.

## 2018-11-21 NOTE — ED Notes (Signed)
Have given pt a meal tray  

## 2018-11-21 NOTE — ED Provider Notes (Signed)
[facility] EMERGENCY DEPARTMENT Provider Note   CSN: [id number] Arrival date & time: 11/21/18  0800     History   Chief Complaint Chief Complaint  Patient presents with  . Respiratory Distress    HPI Jerry Hooper is a 78 y.o. male.  The history is provided by the patient and the EMS personnel. The history is limited by the condition of the patient (acuity of condition).  Pt was seen at 0805. Per EMS, pt's family and pt: Family called EMS for resp distress. EMS states pt's lungs were "diminished" on scene, and RR "32." EMS gave one SL ntg and applied CPAP. Mask apparently was not fitting well en route, and EMS had to hold the mask on pt's face. Pt anxious en route. On arrival to ED, pt only c/o "not feeling well."    Past Medical History:  Diagnosis Date  . CHF (congestive heart failure) (HCC)   . COPD (chronic obstructive pulmonary disease) (HCC)   . Diabetes mellitus without complication (HCC)   . Hypertension   . SVT (supraventricular tachycardia) [facility])     Patient Active Problem List   Diagnosis Date Noted  . PVC (premature ventricular contraction) 11/12/2016  . Essential hypertension 11/12/2016  . Type 2 diabetes mellitus with complication (HCC) 11/12/2016  . Agent orange exposure 11/12/2016  . AVNRT (AV nodal re-entry tachycardia) (HCC) 11/12/2016  . CAD (coronary artery disease) 11/12/2016  . COPD (chronic obstructive pulmonary disease) (HCC) 11/12/2016  . CHF (congestive heart failure) (HCC) 11/12/2016    Past Surgical History:  Procedure Laterality Date  . CARDIAC ELECTROPHYSIOLOGY MAPPING AND ABLATION    . CHOLECYSTECTOMY    . HERNIA REPAIR    . SHOULDER OPEN ROTATOR CUFF REPAIR Right   . TONSILLECTOMY          Home Medications    Prior to Admission medications   Medication Sig Start Date End Date Taking? Authorizing Provider  aspirin 81 MG tablet Take 81 mg by mouth daily.    [provider]  carvedilol (COREG) 6.25 MG tablet Take 1  tablet (6.25 mg total) by mouth 2 (two) times daily. 03/18/17   Camnitz, Andree Coss, MD  clobetasol cream (TEMOVATE) 0.05 % Apply 1 application topically 2 (two) times daily.    [provider]  desonide (DESOWEN) 0.05 % cream Apply 1 application topically 2 (two) times daily.    [provider]  furosemide (LASIX) 20 MG tablet Take 40 mg by mouth daily.    [provider]  glipiZIDE (GLUCOTROL) 5 MG tablet Take 2.5 mg by mouth daily before breakfast.    [provider]  losartan (COZAAR) 50 MG tablet Take 1 tablet (50 mg total) by mouth daily. 03/20/17 06/18/17  Camnitz, Andree Coss, MD  omeprazole (PRILOSEC) 20 MG capsule Take 20 mg by mouth daily.    [provider]  pravastatin (PRAVACHOL) 80 MG tablet Take 40 mg by mouth at bedtime.    [provider]  prednisoLONE acetate (PRED FORTE) 1 % ophthalmic suspension Place 1 drop into both eyes daily.    [provider]  spironolactone (ALDACTONE) 25 MG tablet Take 2 tablets (50 mg) by mouth daily    [provider]  torsemide (DEMADEX) 20 MG tablet Take 2 tablets (40 mg) by mouth daily    [provider]    Family History Family History  Problem Relation Age of Onset  . Other Mother        BRAIN TUMOR  .  Pulmonary embolism Mother        S/P SURGERY  . Emphysema Father   . Neurologic Disorder Sister        effects walking    Social History Social History   Tobacco Use  . Smoking status: Former Games developermoker  . Smokeless tobacco: Never Used  Substance Use Topics  . Alcohol use: No  . Drug use: No     Allergies   Ace inhibitors   Review of Systems Review of Systems  Unable to perform ROS: Acuity of condition     Physical Exam Updated Vital Signs BP (!) 156/53 (BP Location: Left Arm)   Pulse 80   Temp (!) 102.7 F (39.3 C) (Rectal)   Resp (!) 21   Wt 117.9 kg   SpO2 93%   BMI 32.50 kg/m    Patient Vitals for the past 24 hrs:  BP Temp Temp src  Pulse Resp SpO2 Weight  11/21/18 0930 112/72 - - 88 19 100 % -  11/21/18 0900 (!) 123/56 - - 70 17 100 % -  11/21/18 0821 - (!) 102.7 F (39.3 C) Rectal - - - -  11/21/18 0809 (!) 156/53 (!) 100.9 F (38.3 C) Oral 80 (!) 21 93 % -  11/21/18 0807 - - - - - 95 % -  11/21/18 0803 - - - - - - 117.9 kg     Physical Exam 0810: Physical examination:  Nursing notes reviewed; Vital signs and O2 SAT reviewed;  Constitutional: Well developed, Well nourished, Mild distress; Head:  Normocephalic, atraumatic; Eyes: EOMI, PERRL, No scleral icterus; ENMT: Mouth and pharynx normal, Mucous membranes dry; Neck: Supple, Full range of motion, No lymphadenopathy; Cardiovascular: Regular rate and rhythm, No gallop; Respiratory: Breath sounds diminished & equal bilaterally, faint scattered wheezes. Sitting upright. Mildly tachypneic. Speaking short sentences, Normal respiratory effort/excursion; Chest: Nontender, Movement normal; Abdomen: Soft, Nontender, Nondistended, Normal bowel sounds; Genitourinary: No CVA tenderness; Extremities: Peripheral pulses normal, No tenderness, +2 pedal edema bilat. No calf asymmetry.; Neuro: Awake, alert, vague historian. No facial droop. Speech clear. No gross focal motor deficits in extremities.; Skin: Color normal, Warm, Dry.; Psych:  Calming when proper Bipap mask applied to face on ED arrival.     ED Treatments / Results  Labs (all labs ordered are listed, but only abnormal results are displayed)   EKG EKG Interpretation  Date/Time:  Saturday November 21 2018 08:08:47 EST Ventricular Rate:  79 PR Interval:    QRS Duration: 133 QT Interval:  604 QTC Calculation: 697 R Axis:   -25 Text Interpretation:  Poor data quality Atrial fibrillation Ventricular premature complex Aberrant conduction of SV complex(es) Nonspecific intraventricular conduction delay Borderline T abnormalities, inferior leads Baseline wander Repeat tracings suggested Confirmed by Samuel JesterMcManus, Lashya Passe  [phone]([id number]) on 11/21/2018 8:31:43 AM    EKG Interpretation  Date/Time:  Saturday November 21 2018 08:32:14 EST Ventricular Rate:  67 PR Interval:    QRS Duration: 122 QT Interval:  449 QTC Calculation: 474 R Axis:   -29 Text Interpretation:  Poor data quality Sinus rhythm Borderline prolonged PR interval Nonspecific intraventricular conduction delay Inferior infarct, old Probable anterior infarct, age indeterminate Baseline wander No older tracing to compare Confirmed by Samuel JesterMcManus, Leanor Voris [phone]([id number]) on 11/21/2018 8:43:45 AM          Radiology   Procedures Procedures (including critical care time)  Medications Ordered in ED Medications  acetaminophen (TYLENOL) suppository 650 mg (650 mg Rectal Given 11/21/18 0818)  albuterol (PROVENTIL,VENTOLIN) solution continuous neb (10  mg/hr Nebulization Given 11/21/18 0831)  ipratropium (ATROVENT) nebulizer solution 1 mg (1 mg Nebulization Given 11/21/18 0830)     Initial Impression / Assessment and Plan / ED Course  I have reviewed the triage vital signs and the nursing notes.  Pertinent labs & imaging results that were available during my care of the patient were reviewed by me and considered in my medical decision making (see chart for details).  MDM Reviewed: previous chart, nursing note and vitals Reviewed previous: labs and ECG Interpretation: labs, ECG and x-ray Total time providing critical care: 30-74 minutes. This excludes time spent performing separately reportable procedures and services. Consults: admitting MD   CRITICAL CARE Performed by: Samuel Jester Total critical care time: 35 minutes Critical care time was exclusive of separately billable procedures and treating other patients. Critical care was necessary to treat or prevent imminent or life-threatening deterioration. Critical care was time spent personally by me on the following activities: development of treatment plan with patient and/or surrogate as well as nursing,  discussions with consultants, evaluation of patient's response to treatment, examination of patient, obtaining history from patient or surrogate, ordering and performing treatments and interventions, ordering and review of laboratory studies, ordering and review of radiographic studies, pulse oximetry and re-evaluation of patient's condition.  Results for orders placed or performed during the hospital encounter of 11/21/18  Basic metabolic panel  Result Value Ref Range   Sodium 135 135 - 145 mmol/L   Potassium 4.1 3.5 - 5.1 mmol/L   Chloride 98 98 - 111 mmol/L   CO2 25 22 - 32 mmol/L   Glucose, Bld 228 (H) 70 - 99 mg/dL   BUN 30 (H) 8 - 23 mg/dL   Creatinine, Ser 1.61 (H) 0.61 - 1.24 mg/dL   Calcium 8.7 (L) 8.9 - 10.3 mg/dL   GFR calc non Af Amer 28 (L) >60 mL/min   GFR calc Af Amer 33 (L) >60 mL/min   Anion gap 12 5 - 15  Brain natriuretic peptide  Result Value Ref Range   B Natriuretic Peptide 413.0 (H) 0.0 - 100.0 pg/mL  Troponin I - Once  Result Value Ref Range   Troponin I <0.03 <0.03 ng/mL  Lactic acid, plasma  Result Value Ref Range   Lactic Acid, Venous 1.4 0.5 - 1.9 mmol/L  CBC with Differential  Result Value Ref Range   WBC 12.8 (H) 4.0 - 10.5 K/uL   RBC 3.80 (L) 4.22 - 5.81 MIL/uL   Hemoglobin 10.7 (L) 13.0 - 17.0 g/dL   HCT 09.6 (L) 04.5 - 40.9 %   MCV 91.1 80.0 - 100.0 fL   MCH 28.2 26.0 - 34.0 pg   MCHC 30.9 30.0 - 36.0 g/dL   RDW 81.1 91.4 - 78.2 %   Platelets 197 150 - 400 K/uL   nRBC 0.0 0.0 - 0.2 %   Neutrophils Relative % 89 %   Neutro Abs 11.5 (H) 1.7 - 7.7 K/uL   Lymphocytes Relative 4 %   Lymphs Abs 0.5 (L) 0.7 - 4.0 K/uL   Monocytes Relative 5 %   Monocytes Absolute 0.6 0.1 - 1.0 K/uL   Eosinophils Relative 1 %   Eosinophils Absolute 0.1 0.0 - 0.5 K/uL   Basophils Relative 0 %   Basophils Absolute 0.0 0.0 - 0.1 K/uL   Immature Granulocytes 1 %   Abs Immature Granulocytes 0.08 (H) 0.00 - 0.07 K/uL  Protime-INR  Result Value Ref Range    Prothrombin Time 13.2 11.4 - 15.2  seconds   INR 1.01   Urinalysis, Routine w reflex microscopic  Result Value Ref Range   Color, Urine YELLOW YELLOW   APPearance CLEAR CLEAR   Specific Gravity, Urine 1.016 1.005 - 1.030   pH 6.0 5.0 - 8.0   Glucose, UA 150 (A) NEGATIVE mg/dL   Hgb urine dipstick NEGATIVE NEGATIVE   Bilirubin Urine NEGATIVE NEGATIVE   Ketones, ur NEGATIVE NEGATIVE mg/dL   Protein, ur >=546 (A) NEGATIVE mg/dL   Nitrite NEGATIVE NEGATIVE   Leukocytes, UA NEGATIVE NEGATIVE   RBC / HPF 0-5 0 - 5 RBC/hpf   WBC, UA 0-5 0 - 5 WBC/hpf   Bacteria, UA NONE SEEN NONE SEEN   Squamous Epithelial / LPF 0-5 0 - 5   Mucus PRESENT   Influenza panel by PCR (type A & B)  Result Value Ref Range   Influenza A By PCR NEGATIVE NEGATIVE   Influenza B By PCR NEGATIVE NEGATIVE  CBG monitoring, ED  Result Value Ref Range   Glucose-Capillary 221 (H) 70 - 99 mg/dL   Dg Chest Port 1 View Result Date: 11/21/2018 CLINICAL DATA:  78 year old male with a history of shortness of breath EXAM: PORTABLE CHEST 1 VIEW COMPARISON:  None. FINDINGS: Cardiomediastinal silhouette enlarged. No evidence of central vascular congestion. No interlobular septal thickening. Left base has been excluded from the exam. No pneumothorax. No large pleural effusion. Patchy opacity at the right lung base. Surgical changes of the right humerus IMPRESSION: Opacity at the right lung base, potentially atelectasis or pneumonia. Electronically Signed   By: Gilmer Mor D.O.   On: 11/21/2018 09:17    0955:  On arrival: pt anxious, Sats 93% on EMS bipap (not well fitting). RT placed pt on our Bipap machine, hour long neb started with Sats slowly improving to 100%. APAP given for fever. BC and UC obtained before IV abx started for CAP. Pt appears less anxious at this time, resps without distress; though is picking at the bipap mask intermittently. H/H low and BUN/Cr elevated; no old to compare. BNP also mildly elevated, but CXR without  overt pulmonary edema. Dx and testing d/w pt and family.  Questions answered.  Verb understanding, agreeable to admit.  T/C returned from [facility]. Kerry Hough, case discussed, including:  HPI, pertinent PM/SHx, VS/PE, dx testing, ED course and treatment:  Agreeable to admit.      Final Clinical Impressions(s) / ED Diagnoses   Final diagnoses:  None    ED Discharge Orders    None       Samuel Jester, DO 11/23/18 1502

## 2018-11-21 NOTE — Progress Notes (Signed)
Pharmacy Antibiotic Note  Jerry Hooper is a 78 y.o. male admitted on 11/21/2018 with pneumonia.  Pharmacy has been consulted for Vancomycin and cefepime dosing.  Plan: Vancomycin 2000mg  loading dose then 1250mg   IV every 24 hours.  Goal trough 15-20 mcg/mL.  Cefepime 2gm IV q24h F/U cxs and clinical progress Monitor V/S, labs and levels as indicated  Height: 6\' 3"  (190.5 cm) Weight: 260 lb (117.9 kg) IBW/kg (Calculated) : 84.5  Temp (24hrs), Avg:101.8 F (38.8 C), Min:100.9 F (38.3 C), Max:102.7 F (39.3 C)  Recent Labs  Lab 11/21/18 0813 11/21/18 1028  WBC 12.8*  --   CREATININE 2.17*  --   LATICACIDVEN 1.4 2.5*    Estimated Creatinine Clearance: 39.5 mL/min (A) (by C-G formula based on SCr of 2.17 mg/dL (H)).   Normalized CrCL is 29mls/min  Allergies  Allergen Reactions  . Ace Inhibitors Swelling    Antimicrobials this admission: Vancomycin 2/8 >>  Cefepime 2/8 >>   Dose adjustments this admission: n/a  Microbiology results: 2/8 BCx: pending  UCx:   MRSA PCR:  Thank you for allowing pharmacy to be a part of this patient's care.  Elder Cyphers, BS Pharm D, BCPS Clinical Pharmacist Pager [phone] 11/21/2018 2:02 PM

## 2018-11-22 ENCOUNTER — Inpatient Hospital Stay (HOSPITAL_COMMUNITY): Payer: Medicare Other

## 2018-11-22 DIAGNOSIS — I471 Supraventricular tachycardia: Secondary | ICD-10-CM

## 2018-11-22 DIAGNOSIS — N183 Chronic kidney disease, stage 3 unspecified: Secondary | ICD-10-CM | POA: Diagnosis present

## 2018-11-22 LAB — CBC
HCT: 29.5 % — ABNORMAL LOW (ref 39.0–52.0)
Hemoglobin: 8.6 g/dL — ABNORMAL LOW (ref 13.0–17.0)
MCH: 27.2 pg (ref 26.0–34.0)
MCHC: 29.2 g/dL — ABNORMAL LOW (ref 30.0–36.0)
MCV: 93.4 fL (ref 80.0–100.0)
NRBC: 0 % (ref 0.0–0.2)
Platelets: 155 10*3/uL (ref 150–400)
RBC: 3.16 MIL/uL — AB (ref 4.22–5.81)
RDW: 14.6 % (ref 11.5–15.5)
WBC: 10.4 10*3/uL (ref 4.0–10.5)

## 2018-11-22 LAB — COMPREHENSIVE METABOLIC PANEL
ALT: 11 U/L (ref 0–44)
AST: 13 U/L — ABNORMAL LOW (ref 15–41)
Albumin: 2.9 g/dL — ABNORMAL LOW (ref 3.5–5.0)
Alkaline Phosphatase: 47 U/L (ref 38–126)
Anion gap: 11 (ref 5–15)
BUN: 41 mg/dL — ABNORMAL HIGH (ref 8–23)
CO2: 20 mmol/L — ABNORMAL LOW (ref 22–32)
Calcium: 8.1 mg/dL — ABNORMAL LOW (ref 8.9–10.3)
Chloride: 104 mmol/L (ref 98–111)
Creatinine, Ser: 2.46 mg/dL — ABNORMAL HIGH (ref 0.61–1.24)
GFR calc non Af Amer: 24 mL/min — ABNORMAL LOW (ref >60)
GFR, EST AFRICAN AMERICAN: 28 mL/min — AB (ref >60)
Glucose, Bld: 296 mg/dL — ABNORMAL HIGH (ref 70–99)
Potassium: 4.5 mmol/L (ref 3.5–5.1)
Sodium: 135 mmol/L (ref 135–145)
Total Bilirubin: 0.5 mg/dL (ref 0.3–1.2)
Total Protein: 5.4 g/dL — ABNORMAL LOW (ref 6.5–8.1)

## 2018-11-22 LAB — GLUCOSE, CAPILLARY
GLUCOSE-CAPILLARY: 323 mg/dL — AB (ref 70–99)
Glucose-Capillary: 275 mg/dL — ABNORMAL HIGH (ref 70–99)
Glucose-Capillary: 309 mg/dL — ABNORMAL HIGH (ref 70–99)
Glucose-Capillary: 337 mg/dL — ABNORMAL HIGH (ref 70–99)
Glucose-Capillary: 362 mg/dL — ABNORMAL HIGH (ref 70–99)
Glucose-Capillary: 427 mg/dL — ABNORMAL HIGH (ref 70–99)

## 2018-11-22 LAB — MRSA PCR SCREENING: MRSA by PCR: NEGATIVE

## 2018-11-22 MED ORDER — SODIUM CHLORIDE 0.9 % IV SOLN
1.0000 g | INTRAVENOUS | Status: DC
  Administered 2018-11-22 – 2018-11-23 (×2): 1 g via INTRAVENOUS
  Filled 2018-11-22 (×3): qty 1

## 2018-11-22 MED ORDER — VANCOMYCIN HCL 10 G IV SOLR
1500.0000 mg | INTRAVENOUS | Status: DC
  Filled 2018-11-22: qty 1500

## 2018-11-22 MED ORDER — ENSURE ENLIVE PO LIQD
237.0000 mL | Freq: Two times a day (BID) | ORAL | Status: DC
  Administered 2018-11-23: 237 mL via ORAL

## 2018-11-22 MED ORDER — IPRATROPIUM-ALBUTEROL 0.5-2.5 (3) MG/3ML IN SOLN: 3.0000 mL | Freq: Four times a day (QID) | RESPIRATORY_TRACT | Status: DC | PRN

## 2018-11-22 MED ORDER — INSULIN ASPART 100 UNIT/ML ~~LOC~~ SOLN
8.0000 [IU] | Freq: Once | SUBCUTANEOUS | Status: AC
  Administered 2018-11-22: 8 [IU] via SUBCUTANEOUS

## 2018-11-22 MED ORDER — SODIUM CHLORIDE 0.9 % IV SOLN
INTRAVENOUS | Status: AC
  Administered 2018-11-22: 20:00:00 via INTRAVENOUS

## 2018-11-22 MED ORDER — PREDNISONE 20 MG PO TABS
40.0000 mg | ORAL_TABLET | Freq: Every day | ORAL | Status: DC
  Administered 2018-11-23: 40 mg via ORAL
  Filled 2018-11-22: qty 2

## 2018-11-22 NOTE — Progress Notes (Signed)
Pharmacy Antibiotic Note  Jerry Hooper is a 78 y.o. male admitted on 11/21/2018 with pneumonia.  Pharmacy has been consulted for Vancomycin and cefepime dosing. Normalized CrCl worsened a little, adjust abx Plan:  Decrease Vancomycin 1500mg   IV every 48 hours.  Goal trough 15-20 mcg/mL.  Decrease Cefepime 1gm IV q24h F/U cxs and clinical progress Monitor V/S, labs and levels as indicated  Height: 6\' 3"  (190.5 cm) Weight: 258 lb 6.1 oz (117.2 kg) IBW/kg (Calculated) : 84.5  Temp (24hrs), Avg:97.9 F (36.6 C), Min:97.6 F (36.4 C), Max:98.4 F (36.9 C)  Recent Labs  Lab 11/21/18 0813 11/21/18 1028 11/21/18 1437 11/21/18 1816 11/22/18 0528  WBC 12.8*  --   --   --  10.4  CREATININE 2.17*  --   --   --  2.46*  LATICACIDVEN 1.4 2.5* 3.7* 2.6*  --     Estimated Creatinine Clearance: 34.7 mL/min (A) (by C-G formula based on SCr of 2.46 mg/dL (H)).   Normalized CrCL is 76mls/min  Allergies  Allergen Reactions  . Ace Inhibitors Swelling    Antimicrobials this admission: Vancomycin 2/8 >>  Cefepime 2/8 >>   Dose adjustments this admission: 2/9 Vancomycin and Cefepime adjusted  Microbiology results: 2/8 BCx: ngtd 2/8 UCx: TBC  2/9 MRSA PCR: ordered  Thank you for allowing pharmacy to be a part of this patient's care.  Elder Cyphers, BS Pharm D, BCPS Clinical Pharmacist Pager [phone] 11/22/2018 10:02 AM

## 2018-11-22 NOTE — Progress Notes (Signed)
PROGRESS NOTE    Jerry Hooper  FOY:[id number] DOB: 1941-01-19 DOA: 11/21/2018 PCP: Mayra Neer, MD    Brief Narrative:  78 year old male with a history of COPD, CKD stage III, combined CHF, diabetes and hypertension, admitted to the hospital with fever, shortness of breath and pneumonia.  He was initially on BiPAP for respiratory failure but has since been weaned down to nasal cannula.  He also had acute on chronic kidney injury lactic acidosis secondary to sepsis from pneumonia.  He was treated with IV fluids and antibiotics.  Overall fevers have improved.  Cultures are in process.  For COPD exacerbation he was started on intravenous steroids, but has improved and has been transitioned to a prednisone taper.  Anticipate discharge in the next 24 hours if his condition continued to improve.   Assessment & Plan:   Active Problems:   Essential hypertension   Type 2 diabetes mellitus with complication (HCC)   AVNRT (AV nodal re-entry tachycardia) (HCC)   COPD (chronic obstructive pulmonary disease) (HCC)   Acute respiratory failure (HCC)   Chronic combined systolic and diastolic CHF (congestive heart failure) (HCC)   AKI (acute kidney injury) (HCC)   CAP (community acquired pneumonia)   HLD (hyperlipidemia)   GERD (gastroesophageal reflux disease)   Sepsis (HCC)   CKD (chronic kidney disease) stage 3, GFR 30-59 ml/min (HCC)   1. Sepsis.  Secondary to pneumonia.  Fevers have improved.  Hemodynamics have stabilized.  Blood cultures show no growth.  Lactic acid was trending down.  He is on broad-spectrum antibiotics.  If he remains stable for the next 24 hours, anticipate de-escalating to oral antibiotics. 2. Acute kidney injury on chronic kidney disease stage III.  No prior records for comparison, but patient reports he has been told by his primary doctor that his GFR is 35.  Acute kidney injury likely related to sepsis.  Continue gentle hydration since he is still generally weak and  dizzy on standing.  Renal ultrasound does not show any significant findings.  Continue to hold losartan. 3. Acute respiratory failure with hypoxia.  Initially on BiPAP.  Has been weaned down to nasal cannula.  Continue to wean down oxygen as tolerated. 4. COPD.  Wheezing has improved.  He was on intravenous steroids, will transition to prednisone taper. 5. Chronic combined CHF.  He has chronic lower extremity edema.  He is chronically on torsemide, spironolactone.  Will repeat echocardiogram.  Beta-blocker currently on hold in the setting of sepsis.  Resume on discharge. 6. Diabetes, uncontrolled with hyperglycemia.  Likely exacerbated by steroids.  He takes glipizide twice daily at home.  Reports that his blood sugars are usually in the low 100s.  Continue to monitor as steroids are tapered. 7. Hyperlipidemia.  Continue statin 8. GERD.  Continue PPI. 9. 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  Shortness of breath is improving.  He has mild cough.  Feels dizzy on standing.  Feels generally weak.  Objective: Vitals:   11/22/18 0126 11/22/18 0612 11/22/18 0753 11/22/18 1400  BP:  113/69  (!) 147/62  Pulse: 77 68  83  Resp: 16 18  19   Temp:  97.8 F (36.6 C)  98.2 F (36.8 C)  TempSrc:  Oral  Oral  SpO2: 91% 98% 96% 94%  Weight:      Height:        Intake/Output Summary (Last 24 hours) at 11/22/2018 1828 Last data filed at 11/22/2018 0500 Gross per 24 hour  Intake 0 ml  Output -  Net 0 ml   Filed Weights   11/21/18 0803 11/21/18 1331 11/21/18 2018  Weight: 117.9 kg 117.9 kg 117.2 kg    Examination:  General exam: Alert, awake, oriented x 3 Respiratory  system: Occasional wheeze and rhonchi bilaterally. Respiratory effort normal. Cardiovascular system:RRR. No murmurs, rubs, gallops. Gastrointestinal system: Abdomen is nondistended, soft and nontender. No organomegaly or masses felt. Normal bowel sounds heard. Central nervous system: Alert and oriented. No focal neurological deficits. Extremities: 1+ edema bilaterally Skin: No rashes, lesions or ulcers Psychiatry: Judgement and insight appear normal. Mood & affect appropriate.    Data Reviewed: I have personally reviewed following labs and imaging studies  CBC: Recent Labs  Lab 11/21/18 0813 11/22/18 0528  WBC 12.8* 10.4  NEUTROABS 11.5*  --   HGB 10.7* 8.6*  HCT 34.6* 29.5*  MCV 91.1 93.4  PLT 197 155   Basic Metabolic Panel: Recent Labs  Lab 11/21/18 0813 11/22/18 0528  NA 135 135  K 4.1 4.5  CL 98 104  CO2 25 20*  GLUCOSE 228* 296*  BUN 30* 41*  CREATININE 2.17* 2.46*  CALCIUM 8.7* 8.1*   GFR: Estimated Creatinine Clearance: 34.7 mL/min (A) (by C-G formula based on SCr of 2.46 mg/dL (H)). Liver Function Tests: Recent Labs  Lab 11/22/18 0528  AST 13*  ALT 11  ALKPHOS 47  BILITOT 0.5  PROT 5.4*  ALBUMIN 2.9*   No results for input(s): LIPASE, AMYLASE in the last 168 hours. No results for input(s): AMMONIA in the last 168 hours. Coagulation Profile: Recent Labs  Lab 11/21/18 0813  INR 1.01   Cardiac Enzymes: Recent Labs  Lab 11/21/18 0813  TROPONINI <0.03   BNP (last 3 results) No results for input(s): PROBNP in the last 8760 hours. HbA1C: No results for input(s): HGBA1C in the last 72 hours. CBG: Recent Labs  Lab 11/22/18 0011 11/22/18 0344 11/22/18 0756 11/22/18 1225 11/22/18 1552  GLUCAP 427* 323* 275* 362* 337*   Lipid Profile: No results for input(s): CHOL, HDL, LDLCALC, TRIG, CHOLHDL, LDLDIRECT in the last 72 hours. Thyroid Function Tests: No results for input(s): TSH, T4TOTAL, FREET4, T3FREE, THYROIDAB in the last 72  hours. Anemia Panel: No results for input(s): VITAMINB12, FOLATE, FERRITIN, TIBC, IRON, RETICCTPCT in the last 72 hours. Sepsis Labs: Recent Labs  Lab 11/21/18 0813 11/21/18 1028 11/21/18 1437 11/21/18 1816  PROCALCITON  --   --  3.79  --   LATICACIDVEN 1.4 2.5* 3.7* 2.6*    Recent Results (from the past 240 hour(s))  Culture, blood (routine x 2)     Status: None (Preliminary result)   Collection Time: 11/21/18  8:43 AM  Result Value Ref Range Status   Specimen Description   Final    LEFT ANTECUBITAL BOTTLES DRAWN AEROBIC AND ANAEROBIC   Special Requests Blood Culture adequate volume  Final   Culture   Final    NO GROWTH < 24 HOURS Performed at [facility], [address] [place], [place] [address]    Report Status PENDING  Incomplete  Culture, blood (routine x 2)     Status: None (Preliminary result)   Collection Time:  11/21/18  8:49 AM  Result Value Ref Range Status   Specimen Description   Final    BLOOD RIGHT ARM BOTTLES DRAWN AEROBIC AND ANAEROBIC   Special Requests Blood Culture adequate volume  Final   Culture   Final    NO GROWTH < 24 HOURS Performed at [facility], [address], [place], [place] [address]    Report Status PENDING  Incomplete  MRSA PCR Screening     Status: None   Collection Time: 11/22/18 10:01 AM  Result Value Ref Range Status   MRSA by PCR NEGATIVE NEGATIVE Final    Comment:        The GeneXpert MRSA Assay (FDA approved for NASAL specimens only), is one component of a comprehensive MRSA colonization surveillance program. It is not intended to diagnose MRSA infection nor to guide or monitor treatment for MRSA infections. Performed at [facility], [address] [place], [place] [address]          Radiology Studies: US Renal  Result Date: 11/22/2018 CLINICAL DATA:  Acute renal insufficiency. EXAM: RENAL / URINARY TRACT ULTRASOUND COMPLETE COMPARISON:  None. FINDINGS: Right Kidney: Renal measurements: 13.6 x 6.6 x 7.4 cm =  volume: 346.18 mL. Mild renal cortical thinning but normal echogenicity. Small simple cysts are noted. No worrisome renal lesions or hydronephrosis. Left Kidney: Renal measurements: 13.6 x 6.5 x 6.4 cm = volume: 293.27 mL. Mild renal cortical thinning but normal echogenicity. Small cysts are noted. No hydronephrosis. Bladder: Appears normal for degree of bladder distention. IMPRESSION: 1. Mild renal cortical thinning but normal echogenicity and no worrisome renal lesions or hydronephrosis. 2. Small bilateral renal cysts. 3. Normal appearance of the bladder. Electronically Signed   By: Rudie Meyer M.D.   On: 11/22/2018 10:27   Dg Chest Port 1 View  Result Date: 11/21/2018 CLINICAL DATA:  78 year old male with a history of shortness of breath EXAM: PORTABLE CHEST 1 VIEW COMPARISON:  None. FINDINGS: Cardiomediastinal silhouette enlarged. No evidence of central vascular congestion. No interlobular septal thickening. Left base has been excluded from the exam. No pneumothorax. No large pleural effusion. Patchy opacity at the right lung base. Surgical changes of the right humerus IMPRESSION: Opacity at the right lung base, potentially atelectasis or pneumonia. Electronically Signed   By: Gilmer Mor D.O.   On: 11/21/2018 09:17        Scheduled Meds: . aspirin  81 mg Oral Daily  . feeding supplement (ENSURE ENLIVE)  237 mL Oral BID BM  . guaiFENesin  1,200 mg Oral BID  . heparin  5,000 Units Subcutaneous Q8H  . insulin aspart  0-15 Units Subcutaneous TID WC  . insulin aspart  0-5 Units Subcutaneous QHS  . pravastatin  40 mg Oral QHS  . [START ON 11/23/2018] predniSONE  40 mg Oral Q breakfast   Continuous Infusions: . sodium chloride    . ceFEPime (MAXIPIME) IV 1 g (11/22/18 1349)     LOS: 1 day    Time spent:    Erick Blinks, MD [facility]   If 7PM-7AM, please contact night-coverage [email or web address]  11/22/2018, 6:28 PM

## 2018-11-22 NOTE — CV Procedure (Signed)
Echocardiogram attempted but patient eating.

## 2018-11-23 ENCOUNTER — Other Ambulatory Visit (HOSPITAL_COMMUNITY): Payer: Medicare Other

## 2018-11-23 DIAGNOSIS — N178 Other acute kidney failure: Secondary | ICD-10-CM

## 2018-11-23 DIAGNOSIS — N179 Acute kidney failure, unspecified: Secondary | ICD-10-CM | POA: Diagnosis present

## 2018-11-23 DIAGNOSIS — J9601 Acute respiratory failure with hypoxia: Secondary | ICD-10-CM | POA: Diagnosis present

## 2018-11-23 DIAGNOSIS — N183 Chronic kidney disease, stage 3 (moderate): Secondary | ICD-10-CM

## 2018-11-23 DIAGNOSIS — J441 Chronic obstructive pulmonary disease with (acute) exacerbation: Secondary | ICD-10-CM | POA: Diagnosis present

## 2018-11-23 DIAGNOSIS — J181 Lobar pneumonia, unspecified organism: Secondary | ICD-10-CM | POA: Diagnosis present

## 2018-11-23 LAB — BASIC METABOLIC PANEL
Anion gap: 10 (ref 5–15)
BUN: 54 mg/dL — AB (ref 8–23)
CALCIUM: 8.6 mg/dL — AB (ref 8.9–10.3)
CO2: 22 mmol/L (ref 22–32)
Chloride: 102 mmol/L (ref 98–111)
Creatinine, Ser: 2.24 mg/dL — ABNORMAL HIGH (ref 0.61–1.24)
GFR calc Af Amer: 32 mL/min — ABNORMAL LOW (ref >60)
GFR calc non Af Amer: 27 mL/min — ABNORMAL LOW (ref >60)
Glucose, Bld: 274 mg/dL — ABNORMAL HIGH (ref 70–99)
Potassium: 5.1 mmol/L (ref 3.5–5.1)
Sodium: 134 mmol/L — ABNORMAL LOW (ref 135–145)

## 2018-11-23 LAB — URINE CULTURE

## 2018-11-23 LAB — GLUCOSE, CAPILLARY
GLUCOSE-CAPILLARY: 266 mg/dL — AB (ref 70–99)
Glucose-Capillary: 246 mg/dL — ABNORMAL HIGH (ref 70–99)

## 2018-11-23 LAB — CBC
HCT: 31.8 % — ABNORMAL LOW (ref 39.0–52.0)
Hemoglobin: 9.6 g/dL — ABNORMAL LOW (ref 13.0–17.0)
MCH: 27.7 pg (ref 26.0–34.0)
MCHC: 30.2 g/dL (ref 30.0–36.0)
MCV: 91.6 fL (ref 80.0–100.0)
PLATELETS: 205 10*3/uL (ref 150–400)
RBC: 3.47 MIL/uL — ABNORMAL LOW (ref 4.22–5.81)
RDW: 14.4 % (ref 11.5–15.5)
WBC: 12.8 10*3/uL — ABNORMAL HIGH (ref 4.0–10.5)
nRBC: 0 % (ref 0.0–0.2)

## 2018-11-23 MED ORDER — ENSURE ENLIVE PO LIQD: 237.0000 mL | Freq: Two times a day (BID) | ORAL | 12 refills | Status: AC

## 2018-11-23 MED ORDER — PREDNISONE 20 MG PO TABS: 20.0000 mg | ORAL_TABLET | Freq: Every day | ORAL | 0 refills | Status: AC

## 2018-11-23 MED ORDER — DOXYCYCLINE HYCLATE 100 MG PO TABS: 100.0000 mg | ORAL_TABLET | Freq: Two times a day (BID) | ORAL | 0 refills | Status: AC

## 2018-11-23 MED ORDER — IPRATROPIUM-ALBUTEROL 0.5-2.5 (3) MG/3ML IN SOLN: 3.0000 mL | Freq: Four times a day (QID) | RESPIRATORY_TRACT | 0 refills | Status: AC | PRN

## 2018-11-23 MED ORDER — ALUM & MAG HYDROXIDE-SIMETH 200-200-20 MG/5ML PO SUSP
30.0000 mL | Freq: Four times a day (QID) | ORAL | Status: DC | PRN
  Administered 2018-11-23: 30 mL via ORAL
  Filled 2018-11-23: qty 30

## 2018-11-23 NOTE — Progress Notes (Signed)
IV removed to right AC, 2x2 gauze and paper tape applied to site, patient tolerated well.  Reviewed AVS with patient who verbalized understanding.  Patient transported home by his wife.

## 2018-11-23 NOTE — Progress Notes (Signed)
Inpatient Diabetes Program Recommendations  AACE/ADA: New Consensus Statement on Inpatient Glycemic Control  Target Ranges:  Prepandial:   less than 140 mg/dL      Peak postprandial:   less than 180 mg/dL (1-2 hours)      Critically ill patients:  140 - 180 mg/dL   Results for ZAVION, SABATINI (MRN [id number]) as of 11/23/2018 07:30  Ref. Range 11/22/2018 07:56 11/22/2018 12:25 11/22/2018 15:52 11/22/2018 21:43  Glucose-Capillary Latest Ref Range: 70 - 99 mg/dL 449 (H) 675 (H) 916 (H) 309 (H)  Results for JEFERSON, REYMOND (MRN [id number]) as of 11/23/2018 07:30  Ref. Range 11/23/2018 05:08  Glucose Latest Ref Range: 70 - 99 mg/dL 570 (H)   Review of Glycemic Control  Diabetes history: DM2 Outpatient Diabetes medications: Glipizide 2.5 mg QAM Current orders for Inpatient glycemic control: Novolog 0-15 units TID with meals, Novolog 0-5 units QHS; Prednisone 40 mg QAM  Inpatient Diabetes Program Recommendations:  Insulin - Basal: Noted steroids changed to Prednisone and patient last received Solumedrol 60 mg on 11/23/18 at 14:00. If steroids are continued, please consider ordering Lantus 10 units Q24H. Insulin - Meal Coverage: If steroids are continued and post prandial glucose is consistently greater than 180 mg/dl, please consider ordering Novolog 3 units TID with meals for meal coverage if patient eats at least 50% of meals. HbgA1C: Please consider ordering an A1C to evaluate glycemic control over the past 2-3 months.  Thanks, Orlando Penner, RN, MSN, CDE Diabetes Coordinator Inpatient Diabetes Program [phone] (Team Pager from 8am to 5pm)

## 2018-11-23 NOTE — Discharge Summary (Signed)
Physician Discharge Summary  Carole CivilMarvin O Hohensee ZOX:[id number] DOB: [birth year] DOA: 11/21/2018  PCP: Mayra NeerJenkins, Sharrah, MD  Admit date: 11/21/2018 Discharge date: 11/23/2018  Admitted From: Home Disposition: Home  Recommendations for Outpatient Follow-up:  1. Follow-up with PCP in 1 week.  Needs renal function checked during outpatient visit. 2. [facility] (has appointment with cardiology next week).  Home Health: None Equipment/Devices: None  Discharge Condition: Fair CODE STATUS: Full code Diet recommendation: Heart healthy/carb modified     Discharge Diagnoses:  Principal problem Lobar pneumonia (HCC) Sepsis secondary to pneumonia (HCC)  Active Problems:    Acute respiratory failure with hypoxia (HCC)   Lobar pneumonia (HCC)   Acute renal failure superimposed on stage 3 chronic kidney disease (HCC)   COPD with acute exacerbation (HCC)   Essential hypertension   Type 2 diabetes mellitus with hyperglycemia (HCC)   AVNRT (AV nodal re-entry tachycardia) (HCC)   COPD (chronic obstructive pulmonary disease) (HCC)   Chronic combined systolic and diastolic CHF (congestive heart failure) (HCC)   HLD (hyperlipidemia)   GERD (gastroesophageal reflux disease)    Brief narrative/HPI Please refer to admission H&P for details, in brief, 78 year old male with history of chronic kidney disease stage III, combined systolic and diastolic CHF, diabetes, hypertension, COPD presented to the hospital with sepsis secondary to lobar pneumonia with fever and shortness of breath.  He was in acute hypoxic respiratory failure initially requiring BiPAP and then weaned down to nasal cannula.  Also was found to have acute on chronic kidney disease with elevated lactic acid secondary to sepsis from pneumonia.  Given IV fluids and antibiotics.  Placed on empiric antibiotic, IV steroid for COPD exacerbation along with nebs. Clinically improved.  Hospital course Sepsis  secondary to lobar pneumonia (HCC) Acute respiratory failure with hypoxia (HCC) Now improved and stable on room air.  Sepsis resolved.  Received broad-spectrum antibiotics, nebs.  Received empiric vancomycin and cefepime.  Blood cultures without any growth. Will discharge on oral doxycycline to complete 5-day course of antibiotic.  Acute respiratory failure with hypoxia (HCC) Secondary to lobar pneumonia and COPD exacerbation.  He required BiPAP on admission, now weaned to room air.  COPD with acute exacerbation (HCC) Placed on IV Solu-Medrol, duo nebs, antitussives and antibiotic.  Now improved and feels at baseline.  Does not need home O2.  Will discharge on oral prednisone taper over the next 12 days.  He reports using inhalers and has nebulizer at home but ran out of his nebulizer medication.  I will prescribe him with DuoNeb.  Patient does not remember what all inhaler and nebulizer he takes at home. He should follow-up with his PCP next week.  Denies smoking.  Acute renal failure on chronic kidney disease stage III He reports that his GFR has been around 35.  Received IV fluids.  Lasix, losartan and Aldactone were held.  He has mild pitting edema now and will resume his Lasix upon discharge (creatinine of 2.24 today).  Will hold losartan and Aldactone until he sees his PCP sometime next week.  Type 2 diabetes mellitus with hyperglycemia On glipizide at home which will be resumed.  Elevated CBG likely due to IV steroid and being transitioned to oral prednisone taper.  Chronic combined systolic and diastolic CHF Blood pressure medication was held due to sepsis and hypovolemia.  Now euvolemic.  Has chronic leg edema per family.  Continue aspirin, statin, resume beta-blocker and Lasix.  Holding losartan and Aldactone.  Home meds  also listed as being on torsemide which needs to be verified by his PCP.  Last 2D echo from 12/2016 with EF of 45 and 50%.   Procedure: None Family communication:  None at bedside Disposition: Home Consults: None       Discharge Instructions   Allergies as of 11/23/2018      Reactions   Ace Inhibitors Swelling      Medication List    STOP taking these medications   losartan 50 MG tablet Commonly known as:  COZAAR   spironolactone 25 MG tablet Commonly known as:  ALDACTONE   torsemide 20 MG tablet Commonly known as:  DEMADEX     TAKE these medications   aspirin 81 MG tablet Take 81 mg by mouth daily.   carvedilol 6.25 MG tablet Commonly known as:  COREG Take 1 tablet (6.25 mg total) by mouth 2 (two) times daily.   clobetasol cream 0.05 % Commonly known as:  TEMOVATE Apply 1 application topically 2 (two) times daily.   desonide 0.05 % cream Commonly known as:  DESOWEN Apply 1 application topically 2 (two) times daily.   doxycycline 100 MG tablet Commonly known as:  VIBRA-TABS Take 1 tablet (100 mg total) by mouth 2 (two) times daily for 3 days.   feeding supplement (ENSURE ENLIVE) Liqd Take 237 mLs by mouth 2 (two) times daily between meals.   furosemide 20 MG tablet Commonly known as:  LASIX Take 40 mg by mouth daily.   glipiZIDE 5 MG tablet Commonly known as:  GLUCOTROL Take 2.5 mg by mouth daily before breakfast.   ipratropium-albuterol 0.5-2.5 (3) MG/3ML Soln Commonly known as:  DUONEB Take 3 mLs by nebulization every 6 (six) hours as needed.   omeprazole 20 MG capsule Commonly known as:  PRILOSEC Take 20 mg by mouth daily as needed (heartburn).   pravastatin 80 MG tablet Commonly known as:  PRAVACHOL Take 40 mg by mouth at bedtime.   prednisoLONE acetate 1 % ophthalmic suspension Commonly known as:  PRED FORTE Place 1 drop into both eyes daily.   predniSONE 20 MG tablet Commonly known as:  DELTASONE Take 1 tablet (20 mg total) by mouth daily with breakfast.      Follow-up Information    Mayra Neer, MD. Schedule an appointment as soon as possible for a visit in 1 week(s).   Specialty:   Internal Medicine Contact information: [address] [place] [address] [phone]          Allergies  Allergen Reactions  . Ace Inhibitors Swelling        Procedures/Studies: US Renal  Result Date: 11/22/2018 CLINICAL DATA:  Acute renal insufficiency. EXAM: RENAL / URINARY TRACT ULTRASOUND COMPLETE COMPARISON:  None. FINDINGS: Right Kidney: Renal measurements: 13.6 x 6.6 x 7.4 cm = volume: 346.18 mL. Mild renal cortical thinning but normal echogenicity. Small simple cysts are noted. No worrisome renal lesions or hydronephrosis. Left Kidney: Renal measurements: 13.6 x 6.5 x 6.4 cm = volume: 293.27 mL. Mild renal cortical thinning but normal echogenicity. Small cysts are noted. No hydronephrosis. Bladder: Appears normal for degree of bladder distention. IMPRESSION: 1. Mild renal cortical thinning but normal echogenicity and no worrisome renal lesions or hydronephrosis. 2. Small bilateral renal cysts. 3. Normal appearance of the bladder. Electronically Signed   By: Rudie Meyer M.D.   On: 11/22/2018 10:27   Dg Chest Port 1 View  Result Date: 11/21/2018 CLINICAL DATA:  78 year old male with a history of shortness of breath EXAM: PORTABLE  CHEST 1 VIEW COMPARISON:  None. FINDINGS: Cardiomediastinal silhouette enlarged. No evidence of central vascular congestion. No interlobular septal thickening. Left base has been excluded from the exam. No pneumothorax. No large pleural effusion. Patchy opacity at the right lung base. Surgical changes of the right humerus IMPRESSION: Opacity at the right lung base, potentially atelectasis or pneumonia. Electronically Signed   By: Gilmer Mor D.O.   On: 11/21/2018 09:17    (Echo, Carotid, EGD, Colonoscopy, ERCP)    Subjective:   Discharge Exam: Vitals:   11/22/18 2143 11/23/18 0520  BP: (!) 153/71 (!) 144/74  Pulse: 83 74  Resp: 18 18  Temp: 98.5 F (36.9 C) (!) 97.5 F (36.4 C)  SpO2: 97% 99%   Vitals:   11/22/18 1400 11/22/18 1900  11/22/18 2143 11/23/18 0520  BP: (!) 147/62  (!) 153/71 (!) 144/74  Pulse: 83 78 83 74  Resp: 19 16 18 18   Temp: 98.2 F (36.8 C)  98.5 F (36.9 C) (!) 97.5 F (36.4 C)  TempSrc: Oral  Oral Oral  SpO2: 94% 98% 97% 99%  Weight:      Height:        General: Elderly male not in distress HEENT: Moist mucosa, supple neck Chest: Clear to auscultation bilaterally CVS: Normal S1-S2, no murmurs rub or gallop GI: Soft, nondistended, nontender Musculoskeletal: Warm, trace edema bilaterally      The results of significant diagnostics from this hospitalization (including imaging, microbiology, ancillary and laboratory) are listed below for reference.     Microbiology: Recent Results (from the past 240 hour(s))  Urine culture     Status: Abnormal   Collection Time: 11/21/18  8:13 AM  Result Value Ref Range Status   Specimen Description   Final    URINE, CLEAN CATCH Performed at [facility], [address] [place], [place] [address]    Special Requests   Final    NONE Performed at [facility], [address], [place], [place] [address]    Culture MULTIPLE SPECIES PRESENT, SUGGEST RECOLLECTION (A)  Final   Report Status 11/23/2018 FINAL  Final  Culture, blood (routine x 2)     Status: None (Preliminary result)   Collection Time: 11/21/18  8:43 AM  Result Value Ref Range Status   Specimen Description   Final    LEFT ANTECUBITAL BOTTLES DRAWN AEROBIC AND ANAEROBIC   Special Requests Blood Culture adequate volume  Final   Culture   Final    NO GROWTH 2 DAYS Performed at [facility], [address], [place], [place] [address]    Report Status PENDING  Incomplete  Culture, blood (routine x 2)     Status: None (Preliminary result)   Collection Time: 11/21/18  8:49 AM  Result Value Ref Range Status   Specimen Description   Final    BLOOD RIGHT ARM BOTTLES DRAWN AEROBIC AND ANAEROBIC   Special Requests Blood Culture adequate volume  Final   Culture   Final    NO GROWTH 2  DAYS Performed at [facility], [address] [place], [place] [address]    Report Status PENDING  Incomplete  MRSA PCR Screening     Status: None   Collection Time: 11/22/18 10:01 AM  Result Value Ref Range Status   MRSA by PCR NEGATIVE NEGATIVE Final    Comment:        The GeneXpert MRSA Assay (FDA approved for NASAL specimens only), is one component of a comprehensive MRSA colonization surveillance program. It  is not intended to diagnose MRSA infection nor to guide or monitor treatment for MRSA infections. Performed at [facility], [address] [place], [place] [address]      Labs: BNP (last 3 results) Recent Labs    11/21/18 0813  BNP 413.0*   Basic Metabolic Panel: Recent Labs  Lab 11/21/18 0813 11/22/18 0528 11/23/18 0508  NA 135 135 134*  K 4.1 4.5 5.1  CL 98 104 102  CO2 25 20* 22  GLUCOSE 228* 296* 274*  BUN 30* 41* 54*  CREATININE 2.17* 2.46* 2.24*  CALCIUM 8.7* 8.1* 8.6*   Liver Function Tests: Recent Labs  Lab 11/22/18 0528  AST 13*  ALT 11  ALKPHOS 47  BILITOT 0.5  PROT 5.4*  ALBUMIN 2.9*   No results for input(s): LIPASE, AMYLASE in the last 168 hours. No results for input(s): AMMONIA in the last 168 hours. CBC: Recent Labs  Lab 11/21/18 0813 11/22/18 0528 11/23/18 0508  WBC 12.8* 10.4 12.8*  NEUTROABS 11.5*  --   --   HGB 10.7* 8.6* 9.6*  HCT 34.6* 29.5* 31.8*  MCV 91.1 93.4 91.6  PLT 197 155 205   Cardiac Enzymes: Recent Labs  Lab 11/21/18 0813  TROPONINI <0.03   BNP: Invalid input(s): POCBNP CBG: Recent Labs  Lab 11/22/18 0756 11/22/18 1225 11/22/18 1552 11/22/18 2143 11/23/18 0750  GLUCAP 275* 362* 337* 309* 246*   D-Dimer No results for input(s): DDIMER in the last 72 hours. Hgb A1c No results for input(s): HGBA1C in the last 72 hours. Lipid Profile No results for input(s): CHOL, HDL, LDLCALC, TRIG, CHOLHDL, LDLDIRECT in the last 72 hours. Thyroid function studies No results for input(s): TSH,  T4TOTAL, T3FREE, THYROIDAB in the last 72 hours.  Invalid input(s): FREET3 Anemia work up No results for input(s): VITAMINB12, FOLATE, FERRITIN, TIBC, IRON, RETICCTPCT in the last 72 hours. Urinalysis    Component Value Date/Time   COLORURINE YELLOW 11/21/2018 0813   APPEARANCEUR CLEAR 11/21/2018 0813   LABSPEC 1.016 11/21/2018 0813   PHURINE 6.0 11/21/2018 0813   GLUCOSEU 150 (A) 11/21/2018 0813   HGBUR NEGATIVE 11/21/2018 0813   BILIRUBINUR NEGATIVE 11/21/2018 0813   KETONESUR NEGATIVE 11/21/2018 0813   PROTEINUR >=300 (A) 11/21/2018 0813   NITRITE NEGATIVE 11/21/2018 0813   LEUKOCYTESUR NEGATIVE 11/21/2018 0813   Sepsis Labs Invalid input(s): PROCALCITONIN,  WBC,  LACTICIDVEN Microbiology Recent Results (from the past 240 hour(s))  Urine culture     Status: Abnormal   Collection Time: 11/21/18  8:13 AM  Result Value Ref Range Status   Specimen Description   Final    URINE, CLEAN CATCH Performed at [facility], [address] [place], [place] [address]    Special Requests   Final    NONE Performed at [facility], [address], [place], [place] [address]    Culture MULTIPLE SPECIES PRESENT, SUGGEST RECOLLECTION (A)  Final   Report Status 11/23/2018 FINAL  Final  Culture, blood (routine x 2)     Status: None (Preliminary result)   Collection Time: 11/21/18  8:43 AM  Result Value Ref Range Status   Specimen Description   Final    LEFT ANTECUBITAL BOTTLES DRAWN AEROBIC AND ANAEROBIC   Special Requests Blood Culture adequate volume  Final   Culture   Final    NO GROWTH 2 DAYS Performed at [facility], [address] [place], [place] [address]    Report Status PENDING  Incomplete  Culture, blood (routine x 2)  Status: None (Preliminary result)   Collection Time: 11/21/18  8:49 AM  Result Value Ref Range Status   Specimen Description   Final    BLOOD RIGHT ARM BOTTLES DRAWN AEROBIC AND ANAEROBIC   Special Requests Blood Culture adequate volume  Final   Culture    Final    NO GROWTH 2 DAYS Performed at [facility], [address] [place], [place] [address]    Report Status PENDING  Incomplete  MRSA PCR Screening     Status: None   Collection Time: 11/22/18 10:01 AM  Result Value Ref Range Status   MRSA by PCR NEGATIVE NEGATIVE Final    Comment:        The GeneXpert MRSA Assay (FDA approved for NASAL specimens only), is one component of a comprehensive MRSA colonization surveillance program. It is not intended to diagnose MRSA infection nor to guide or monitor treatment for MRSA infections. Performed at [facility], [address] [place], [place] [address]      Time coordinating discharge: 35 minutes  SIGNED:   Eddie NorthNishant Mailen Newborn, MD  [facility] 11/23/2018, 11:03 AM Pager   If 7PM-7AM, please contact night-coverage [email or web address] Password [email or web address]

## 2018-11-23 NOTE — Discharge Instructions (Signed)
Acute Kidney Injury, Adult ° °Acute kidney injury is a sudden worsening of kidney function. The kidneys are organs that have several jobs. They filter the blood to remove waste products and extra fluid. They also maintain a healthy balance of minerals and hormones in the body, which helps control blood pressure and keep bones strong. With this condition, your kidneys do not do their jobs as well as they should. °This condition ranges from mild to severe. Over time it may develop into long-lasting (chronic) kidney disease. Early detection and treatment may prevent acute kidney injury from developing into a chronic condition. °What are the causes? °Common causes of this condition include: °· A problem with blood flow to the kidneys. This may be caused by: °? Low blood pressure (hypotension) or shock. °? Blood loss. °? Heart and blood vessel (cardiovascular) disease. °? Severe burns. °? Liver disease. °· Direct damage to the kidneys. This may be caused by: °? Certain medicines. °? A kidney infection. °? Poisoning. °? Being around or in contact with toxic substances. °? A surgical wound. °? A hard, direct hit to the kidney area. °· A sudden blockage of urine flow. This may be caused by: °? Cancer. °? Kidney stones. °? An enlarged prostate in males. °What are the signs or symptoms? °Symptoms of this condition may not be obvious until the condition becomes severe. Symptoms of this condition can include: °· Tiredness (lethargy), or difficulty staying awake. °· Nausea or vomiting. °· Swelling (edema) of the face, legs, ankles, or feet. °· Problems with urination, such as: °? Abdominal pain, or pain along the side of your stomach (flank). °? Decreased urine production. °? Decrease in the force of urine flow. °· Muscle twitches and cramps, especially in the legs. °· Confusion or trouble concentrating. °· Loss of appetite. °· Fever. °How is this diagnosed? °This condition may be diagnosed with tests, including: °· Blood  tests. °· Urine tests. °· Imaging tests. °· A test in which a sample of tissue is removed from the kidneys to be examined under a microscope (kidney biopsy). °How is this treated? °Treatment for this condition depends on the cause and how severe the condition is. In mild cases, treatment may not be needed. The kidneys may heal on their own. In more severe cases, treatment will involve: °· Treating the cause of the kidney injury. This may involve changing any medicines you are taking or adjusting your dosage. °· Fluids. You may need specialized IV fluids to balance your body's needs. °· Having a catheter placed to drain urine and prevent blockages. °· Preventing problems from occurring. This may mean avoiding certain medicines or procedures that can cause further injury to the kidneys. °In some cases treatment may also require: °· A procedure to remove toxic wastes from the body (dialysis or continuous renal replacement therapy - CRRT). °· Surgery. This may be done to repair a torn kidney, or to remove the blockage from the urinary system. °Follow these instructions at home: °Medicines °· Take over-the-counter and prescription medicines only as told by your health care provider. °· Do not take any new medicines without your health care provider's approval. Many medicines can worsen your kidney damage. °· Do not take any vitamin and mineral supplements without your health care provider's approval. Many nutritional supplements can worsen your kidney damage. °Lifestyle °· If your health care provider prescribed changes to your diet, follow them. You may need to decrease the amount of protein you eat. °· Achieve and maintain a healthy   weight. If you need help with this, ask your health care provider. °· Start or continue an exercise plan. Try to exercise at least 30 minutes a day, 5 days a week. °· Do not use any tobacco products, such as cigarettes, chewing tobacco, and e-cigarettes. If you need help quitting, ask your  health care provider. °General instructions °· Keep track of your blood pressure. Report changes in your blood pressure as told by your health care provider. °· Stay up to date with immunizations. Ask your health care provider which immunizations you need. °· Keep all follow-up visits as told by your health care provider. This is important. °Where to find more information °· American Association of Kidney Patients: www.aakp.org °· National Kidney Foundation: www.kidney.org °· American Kidney Fund: www.akfinc.org °· [employer] Rehabilitation Program: °? www.lifeoptions.org °? www.kidneyschool.org °Contact a health care provider if: °· Your symptoms get worse. °· You develop new symptoms. °Get help right away if: °· You develop symptoms of worsening kidney disease, which include: °? Headaches. °? Abnormally dark or light skin. °? Easy bruising. °? Frequent hiccups. °? Chest pain. °? Shortness of breath. °? End of menstruation in women. °? Seizures. °? Confusion or altered mental status. °? Abdominal or back pain. °? Itchiness. °· You have a fever. °· Your body is producing less urine. °· You have pain or bleeding when you urinate. °Summary °· Acute kidney injury is a sudden worsening of kidney function. °· Acute kidney injury can be caused by problems with blood flow to the kidneys, direct damage to the kidneys, and sudden blockage of urine flow. °· Symptoms of this condition may not be obvious until it becomes severe. Symptoms may include edema, lethargy, confusion, nausea or vomiting, and problems passing urine. °· This condition can usually be diagnosed with blood tests, urine tests, and imaging tests. Sometimes a kidney biopsy is done to diagnose this condition. °· Treatment for this condition often involves treating the underlying cause. It is treated with fluids, medicines, dialysis, diet changes, or surgery. °This information is not intended to replace advice given to you by your health care provider. Make  sure you discuss any questions you have with your health care provider. °Document Released: 04/15/2011 Document Revised: 01/30/2017 Document Reviewed: 09/20/2016 °Elsevier Interactive Patient Education © 2019 [employer] ° °

## 2018-11-26 LAB — CULTURE, BLOOD (ROUTINE X 2)
Culture: NO GROWTH
Culture: NO GROWTH
SPECIAL REQUESTS: ADEQUATE
Special Requests: ADEQUATE

## 2019-07-06 ENCOUNTER — Ambulatory Visit: Payer: Medicare Other | Admitting: Cardiology

## 2020-08-12 IMAGING — CR DG CHEST 1V PORT
1 series · 2 of 2 positions shown · non-contrast
Comparison: None.

CLINICAL DATA: 77-year-old male with a history of shortness of
breath

EXAM:
PORTABLE CHEST 1 VIEW

[Series 1: ap portable · 0.17mm/px · 2 of 2 slices shown]
[im 1/2]
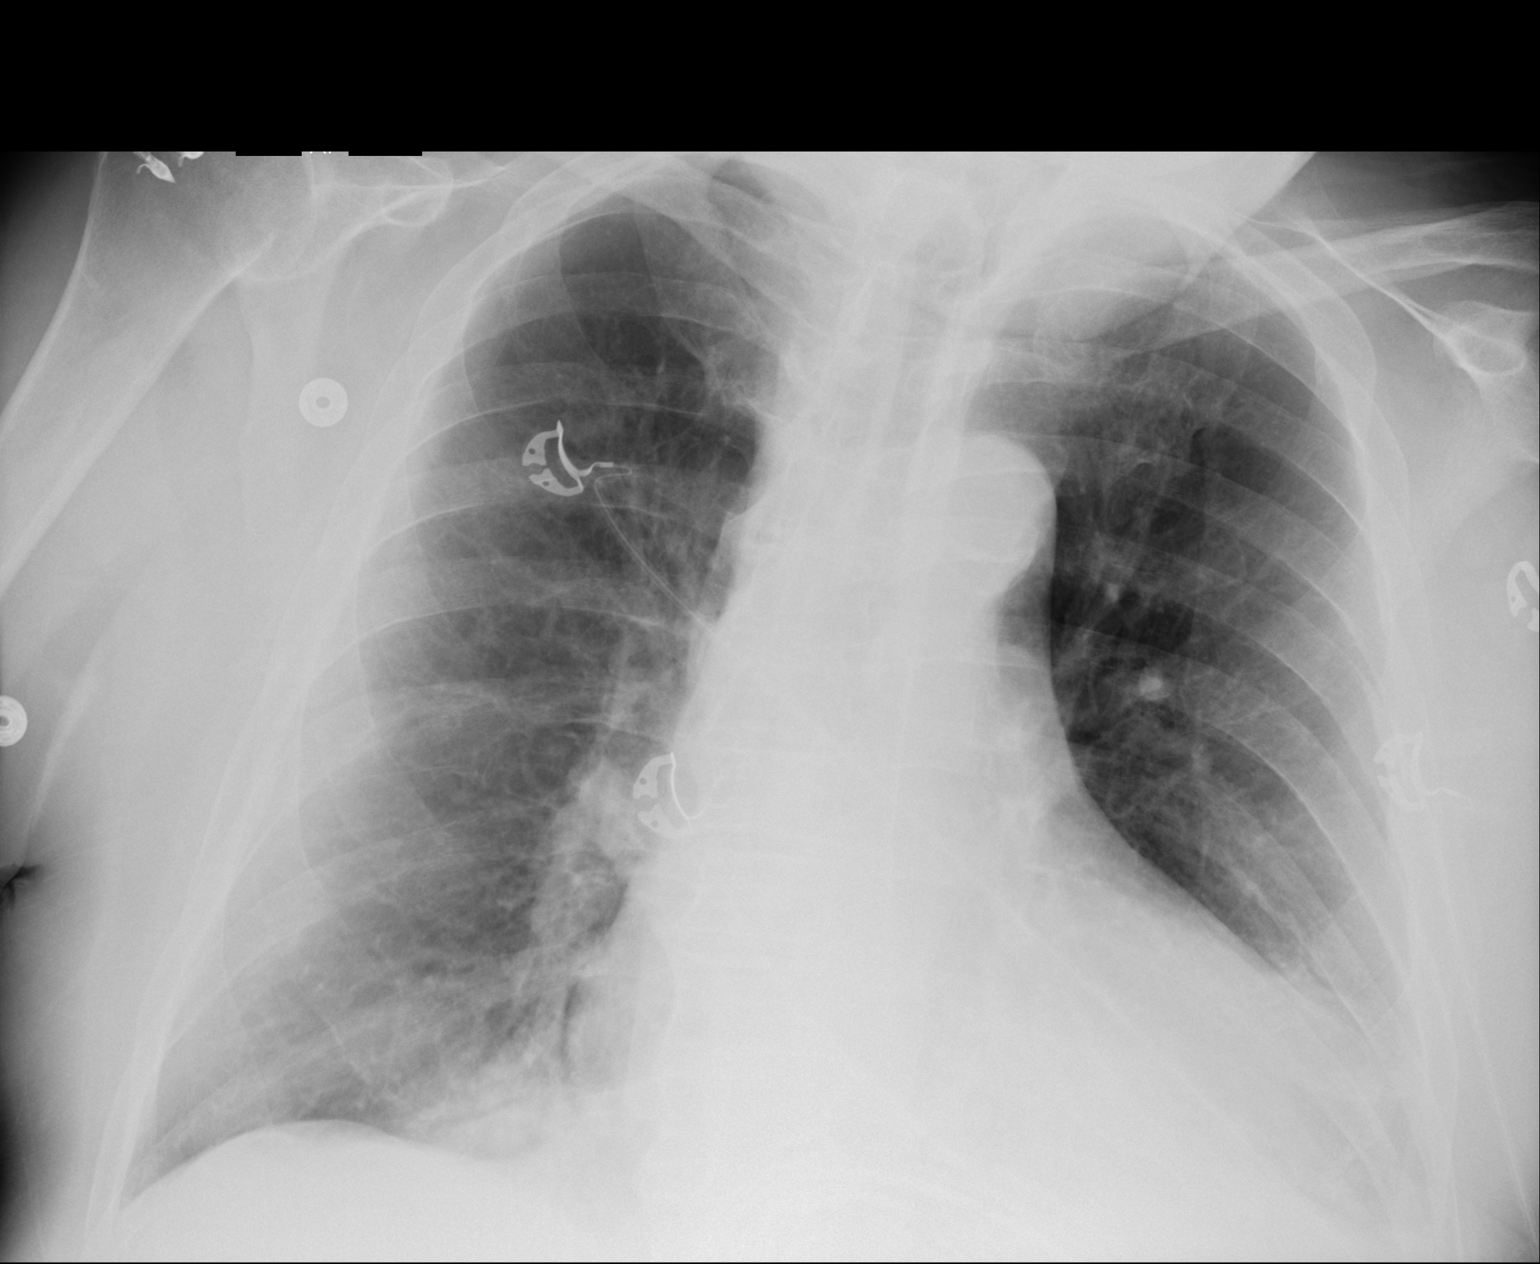
[im 2/2]
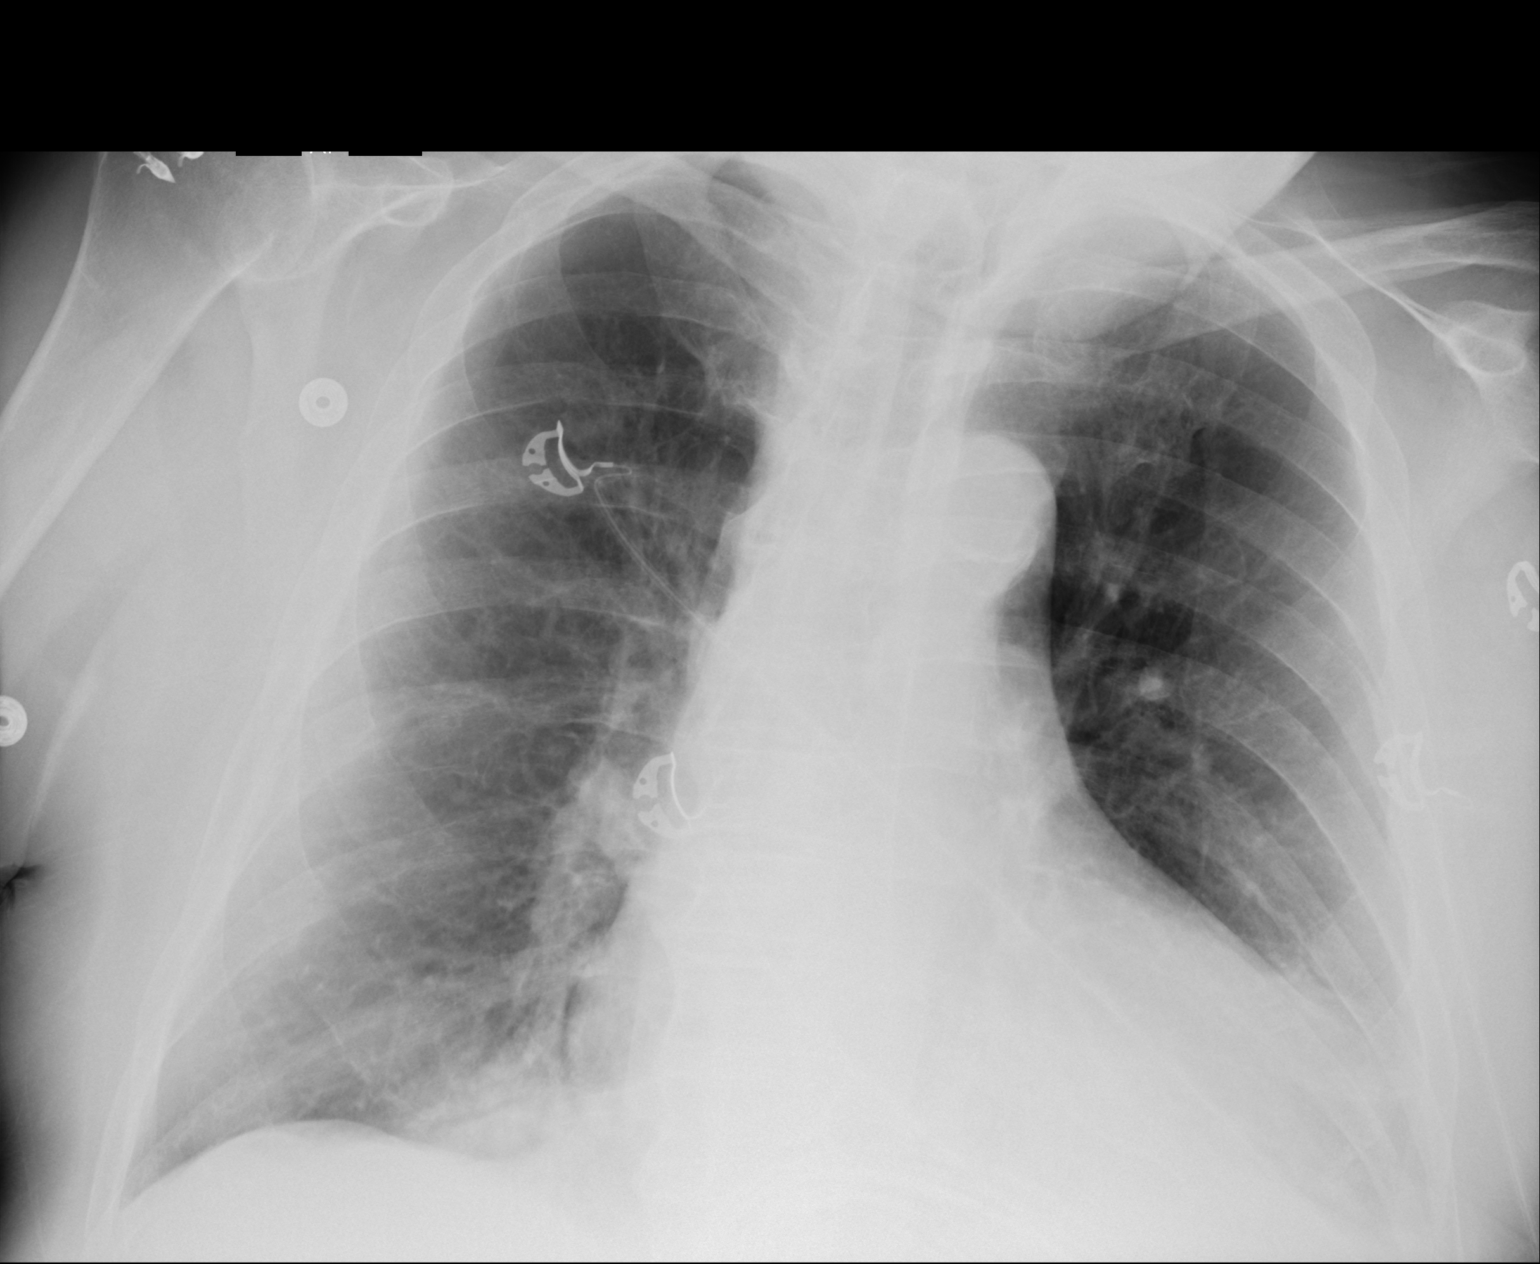

[2 of 2 positions shown; findings below may reference images not displayed]

FINDINGS: Cardiomediastinal silhouette enlarged. No evidence of central
vascular congestion. No interlobular septal thickening.

Left base has been excluded from the exam. No pneumothorax. No large
pleural effusion. Patchy opacity at the right lung base.

Surgical changes of the right humerus
IMPRESSION: Opacity at the right lung base, potentially atelectasis or
pneumonia.

## 2020-08-13 IMAGING — US US RENAL
1 series · 14 of 25 positions shown · non-contrast
Comparison: None.

CLINICAL DATA: Acute renal insufficiency.

EXAM:
RENAL / URINARY TRACT ULTRASOUND COMPLETE

[Series 1: us renal · 14 of 52 slices shown]
[im 1/52]
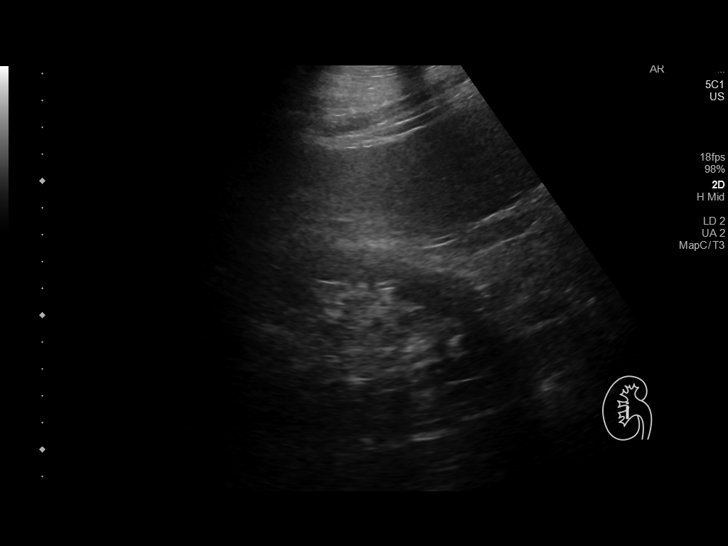
[im 5/52]
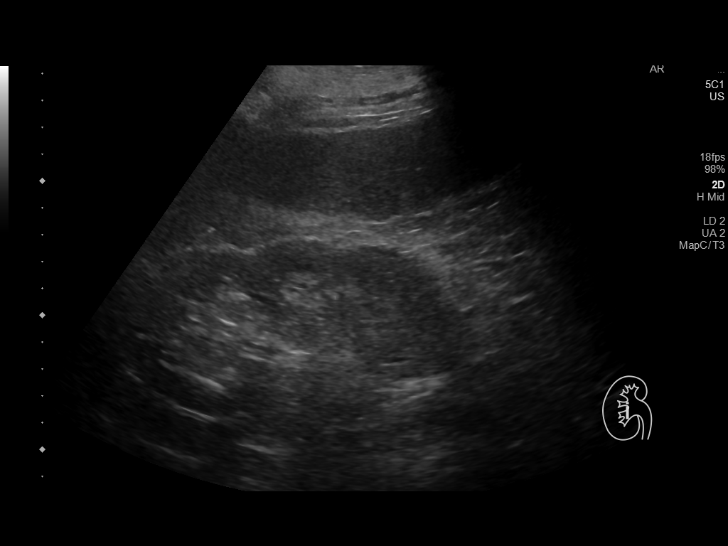
[im 9/52]
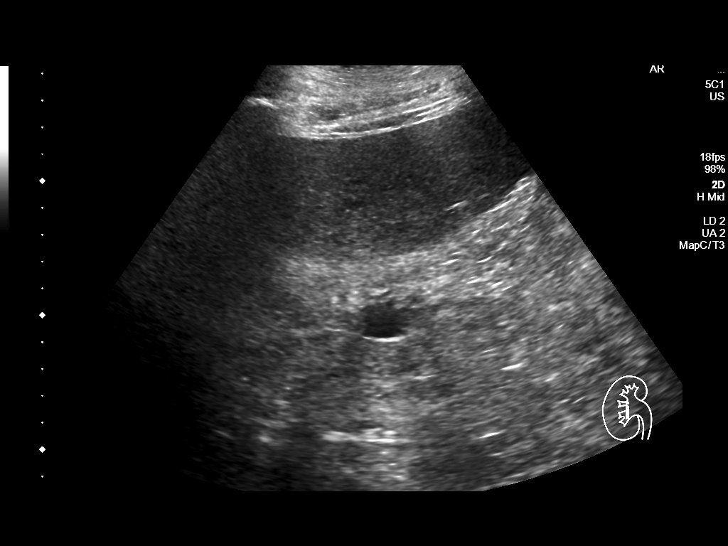
[im 13/52]
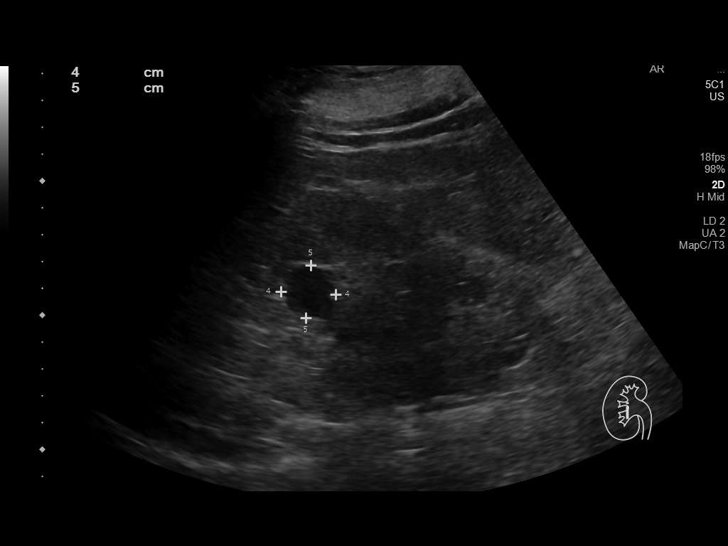
[im 18/52]
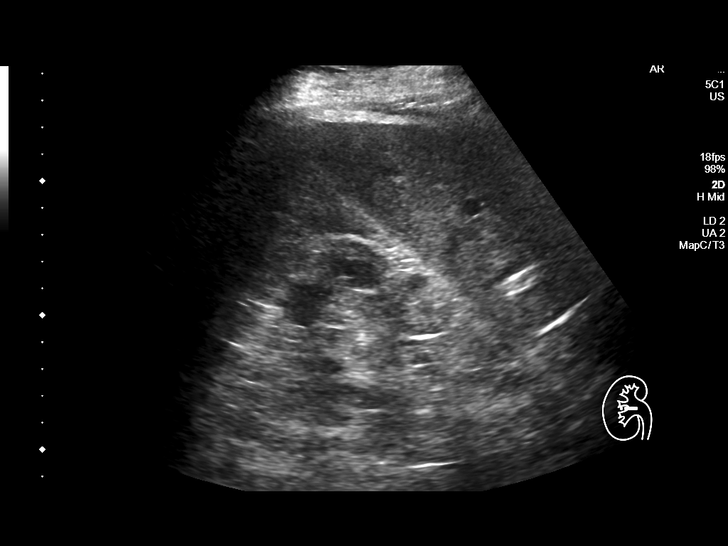
[im 20/52]
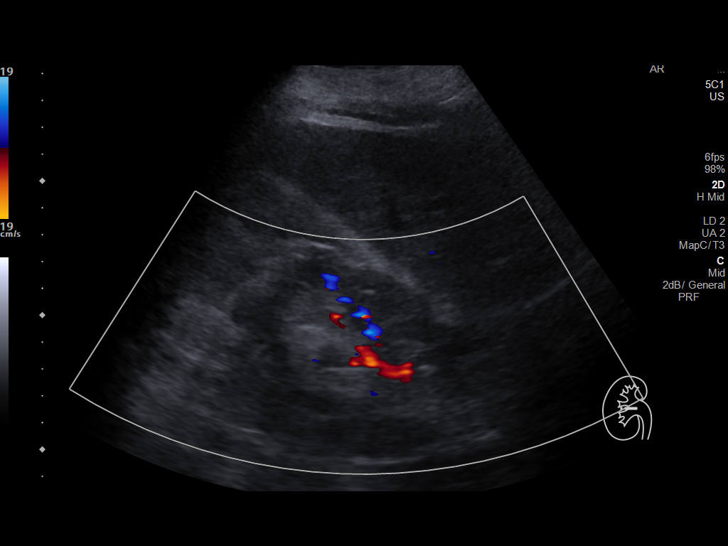
[im 24/52]
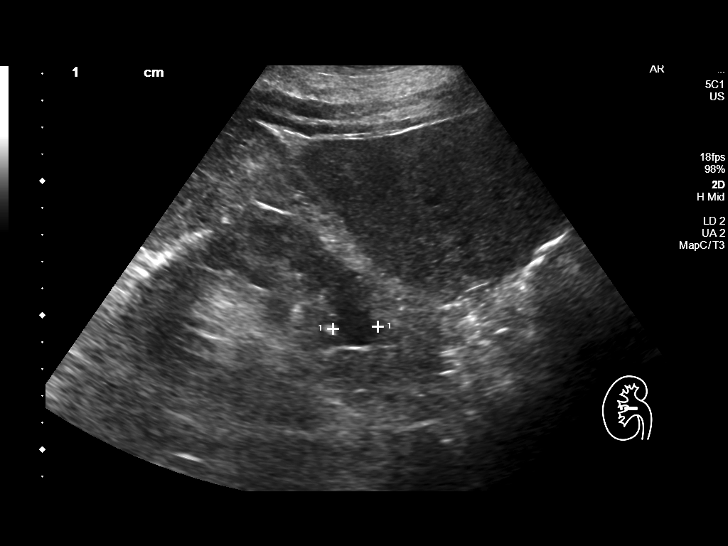
[im 28/52]
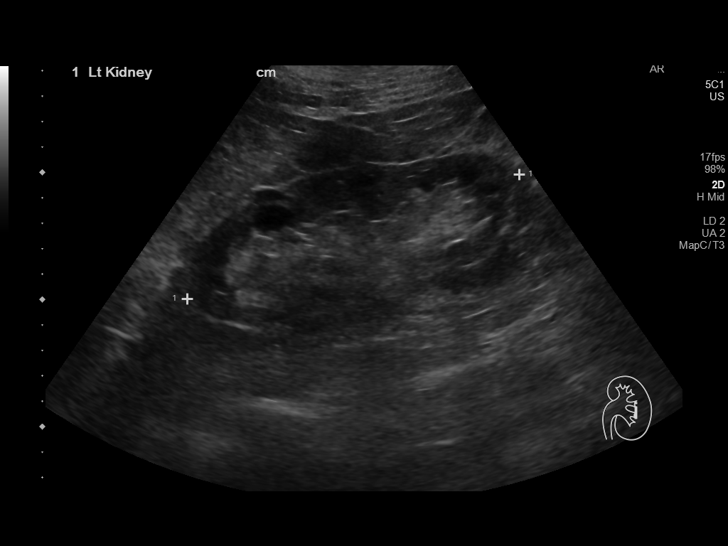
[im 32/52]
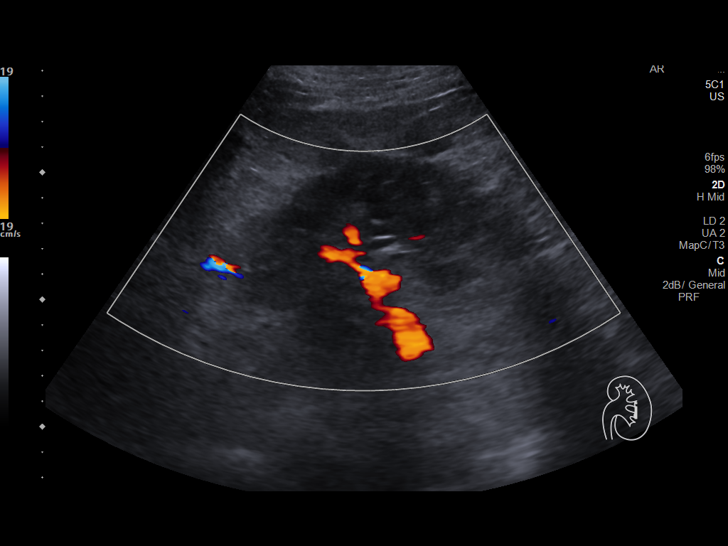
[im 35/52]
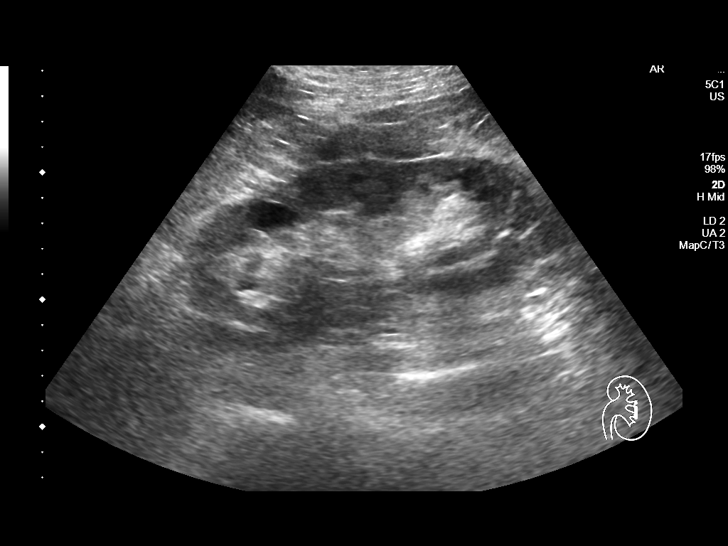
[im 39/52]
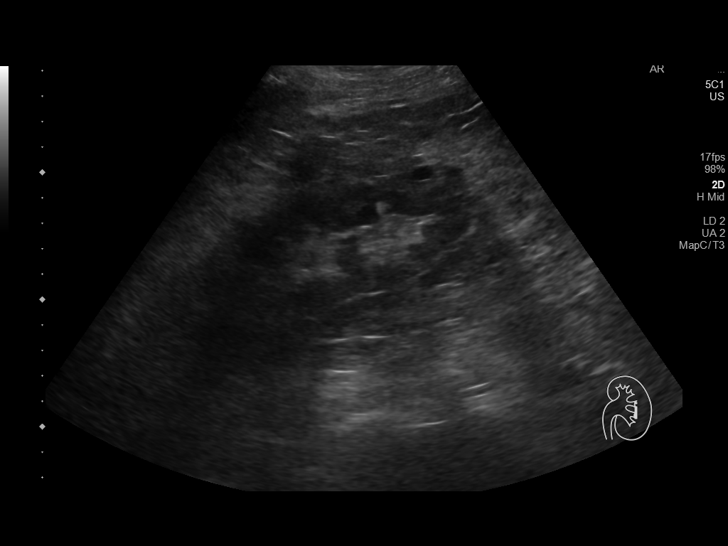
[im 43/52]
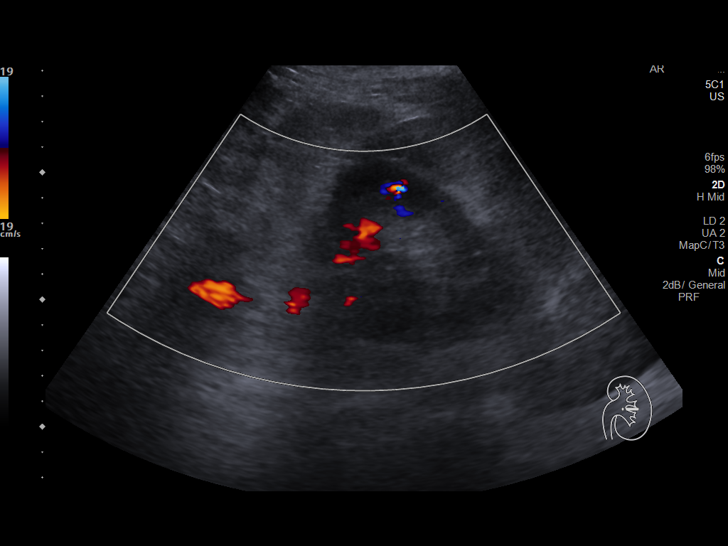
[im 47/52]
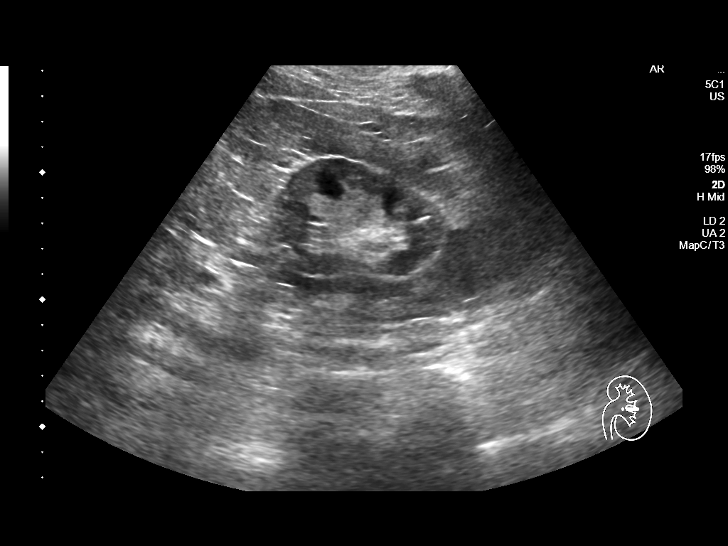
[im 52/52]
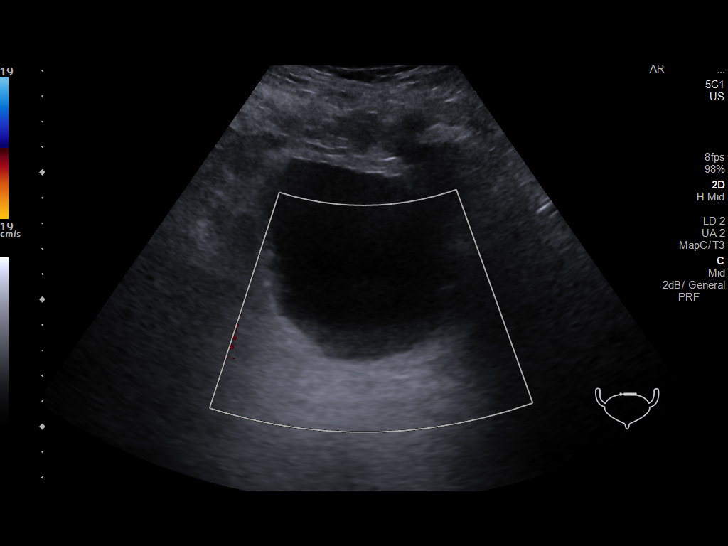

[14 of 25 positions shown; findings below may reference images not displayed]

FINDINGS: Right Kidney:

Renal measurements: 13.6 x 6.6 x 7.4 cm = volume: 346.18 mL. Mild
renal cortical thinning but normal echogenicity. Small simple cysts
are noted. No worrisome renal lesions or hydronephrosis.

Left Kidney:

Renal measurements: 13.6 x 6.5 x 6.4 cm = volume: 293.27 mL. Mild
renal cortical thinning but normal echogenicity. Small cysts are
noted. No hydronephrosis.

Bladder:

Appears normal for degree of bladder distention.
IMPRESSION: 1. Mild renal cortical thinning but normal echogenicity and no
worrisome renal lesions or hydronephrosis.
2. Small bilateral renal cysts.
3. Normal appearance of the bladder.

## 2022-02-11 DEATH — deceased
